# Patient Record
Sex: Female | Born: 1935 | Race: White | Hispanic: No | Smoking: Never smoker
Health system: Southern US, Community
[De-identification: ages and names within clinical notes are randomized; demographics above are authoritative.]

## PROBLEM LIST (undated history)

## (undated) DIAGNOSIS — I429 Cardiomyopathy, unspecified: Secondary | ICD-10-CM

## (undated) DIAGNOSIS — I472 Ventricular tachycardia: Secondary | ICD-10-CM

## (undated) DIAGNOSIS — R079 Chest pain, unspecified: Secondary | ICD-10-CM

## (undated) DIAGNOSIS — I5022 Chronic systolic (congestive) heart failure: Secondary | ICD-10-CM

## (undated) DIAGNOSIS — Z950 Presence of cardiac pacemaker: Secondary | ICD-10-CM

## (undated) DIAGNOSIS — N259 Disorder resulting from impaired renal tubular function, unspecified: Secondary | ICD-10-CM

## (undated) DIAGNOSIS — I10 Essential (primary) hypertension: Secondary | ICD-10-CM

## (undated) HISTORY — DX: Chronic systolic (congestive) heart failure: I50.22

## (undated) HISTORY — DX: Essential (primary) hypertension: I10

## (undated) HISTORY — DX: Disorder resulting from impaired renal tubular function, unspecified: N25.9

## (undated) HISTORY — DX: Chest pain, unspecified: R07.9

## (undated) HISTORY — PX: INSERT / REPLACE / REMOVE PACEMAKER: SUR710

## (undated) HISTORY — DX: Presence of cardiac pacemaker: Z95.0

## (undated) HISTORY — DX: Ventricular tachycardia: I47.2

## (undated) HISTORY — DX: Cardiomyopathy, unspecified: I42.9

---

## 2004-04-10 ENCOUNTER — Other Ambulatory Visit: Payer: Self-pay

## 2004-07-22 ENCOUNTER — Ambulatory Visit: Payer: Self-pay | Admitting: Unknown Physician Specialty

## 2005-03-09 ENCOUNTER — Ambulatory Visit: Payer: Self-pay | Admitting: Ophthalmology

## 2005-03-16 ENCOUNTER — Ambulatory Visit: Payer: Self-pay | Admitting: Ophthalmology

## 2005-08-26 ENCOUNTER — Emergency Department: Payer: Self-pay | Admitting: Emergency Medicine

## 2005-08-26 ENCOUNTER — Other Ambulatory Visit: Payer: Self-pay

## 2005-11-16 ENCOUNTER — Encounter: Payer: Self-pay | Admitting: Internal Medicine

## 2005-12-06 ENCOUNTER — Ambulatory Visit: Payer: Self-pay | Admitting: Family Medicine

## 2006-05-03 ENCOUNTER — Ambulatory Visit: Payer: Self-pay | Admitting: Unknown Physician Specialty

## 2006-07-10 ENCOUNTER — Ambulatory Visit: Payer: Self-pay | Admitting: Podiatry

## 2006-12-04 ENCOUNTER — Ambulatory Visit: Payer: Self-pay | Admitting: Internal Medicine

## 2006-12-15 ENCOUNTER — Ambulatory Visit: Payer: Self-pay | Admitting: Internal Medicine

## 2006-12-15 ENCOUNTER — Observation Stay (HOSPITAL_COMMUNITY): Admission: RE | Admit: 2006-12-15 | Discharge: 2006-12-16 | Payer: Self-pay | Admitting: Internal Medicine

## 2007-01-08 ENCOUNTER — Ambulatory Visit: Payer: Self-pay | Admitting: Internal Medicine

## 2007-03-06 ENCOUNTER — Ambulatory Visit: Payer: Self-pay | Admitting: Internal Medicine

## 2007-04-25 ENCOUNTER — Ambulatory Visit: Payer: Self-pay | Admitting: Family Medicine

## 2007-05-26 ENCOUNTER — Other Ambulatory Visit: Payer: Self-pay

## 2007-05-26 ENCOUNTER — Emergency Department: Payer: Self-pay | Admitting: Emergency Medicine

## 2007-06-08 ENCOUNTER — Ambulatory Visit: Payer: Self-pay

## 2007-07-24 ENCOUNTER — Ambulatory Visit: Payer: Self-pay | Admitting: Family

## 2007-09-04 ENCOUNTER — Ambulatory Visit: Payer: Self-pay | Admitting: Internal Medicine

## 2007-10-29 ENCOUNTER — Ambulatory Visit: Payer: Self-pay | Admitting: Family

## 2007-11-12 ENCOUNTER — Ambulatory Visit: Payer: Self-pay

## 2007-11-22 ENCOUNTER — Ambulatory Visit: Payer: Self-pay | Admitting: Internal Medicine

## 2008-02-25 ENCOUNTER — Ambulatory Visit: Payer: Self-pay | Admitting: Internal Medicine

## 2008-04-29 ENCOUNTER — Ambulatory Visit: Payer: Self-pay | Admitting: Family Medicine

## 2008-06-02 ENCOUNTER — Ambulatory Visit: Payer: Self-pay | Admitting: Internal Medicine

## 2008-10-15 ENCOUNTER — Ambulatory Visit: Payer: Self-pay | Admitting: Family

## 2008-11-28 ENCOUNTER — Encounter: Payer: Self-pay | Admitting: Internal Medicine

## 2008-12-08 ENCOUNTER — Ambulatory Visit: Payer: Self-pay | Admitting: Internal Medicine

## 2009-02-05 ENCOUNTER — Ambulatory Visit: Payer: Self-pay | Admitting: Family

## 2009-02-23 DIAGNOSIS — I429 Cardiomyopathy, unspecified: Secondary | ICD-10-CM | POA: Insufficient documentation

## 2009-02-23 DIAGNOSIS — R079 Chest pain, unspecified: Secondary | ICD-10-CM

## 2009-02-23 DIAGNOSIS — I472 Ventricular tachycardia: Secondary | ICD-10-CM

## 2009-02-23 DIAGNOSIS — I10 Essential (primary) hypertension: Secondary | ICD-10-CM | POA: Insufficient documentation

## 2009-02-23 DIAGNOSIS — N259 Disorder resulting from impaired renal tubular function, unspecified: Secondary | ICD-10-CM | POA: Insufficient documentation

## 2009-04-27 ENCOUNTER — Ambulatory Visit: Payer: Self-pay | Admitting: Family

## 2009-04-30 ENCOUNTER — Ambulatory Visit: Payer: Self-pay | Admitting: Family Medicine

## 2009-05-21 ENCOUNTER — Inpatient Hospital Stay: Payer: Self-pay | Admitting: Internal Medicine

## 2009-09-09 ENCOUNTER — Ambulatory Visit: Payer: Self-pay | Admitting: Family

## 2009-12-28 ENCOUNTER — Ambulatory Visit: Payer: Self-pay | Admitting: Internal Medicine

## 2009-12-28 DIAGNOSIS — I4949 Other premature depolarization: Secondary | ICD-10-CM

## 2010-02-25 ENCOUNTER — Ambulatory Visit: Payer: Self-pay | Admitting: Family

## 2010-04-14 ENCOUNTER — Ambulatory Visit: Payer: Self-pay | Admitting: Internal Medicine

## 2010-07-20 ENCOUNTER — Ambulatory Visit: Payer: Self-pay | Admitting: Cardiovascular Disease

## 2010-07-20 ENCOUNTER — Encounter: Payer: Self-pay | Admitting: Internal Medicine

## 2010-10-19 NOTE — Cardiovascular Report (Signed)
Summary: Office Visit   Office Visit   Imported By: Roderic Ovens 04/16/2010 10:21:01  _____________________________________________________________________  External Attachment:    Type:   Image     Comment:   External Document

## 2010-10-19 NOTE — Procedures (Signed)
Summary: PACER/AMD   Current Medications (verified): 1)  Omeprazole 20 Mg Cpdr (Omeprazole) .... Take 1 By Mouth Once Daily 2)  Glipizide 10 Mg Tabs (Glipizide) .... Take 1 By Mouth Once Daily 3)  Carvedilol 25 Mg Tabs (Carvedilol) .... Take One Tablet By Mouth Two Times A Day 4)  Meclizine Hcl 25 Mg Tabs (Meclizine Hcl) .... Take 1 By Mouth Once Daily 5)  Lipitor 40 Mg Tabs (Atorvastatin Calcium) .... Take One Tablet By Mouth Daily. 6)  Detrol La 4 Mg Xr24h-Cap (Tolterodine Tartrate) .... Take 1 By Mouth Once Daily 7)  Levoxyl 137 Mcg Tabs (Levothyroxine Sodium) .... Take 1 By Mouth Once Daily 8)  Tylenol Arthritis Pain 650 Mg Cr-Tabs (Acetaminophen) .... Take 2 By Mouth Once Daily 9)  Aspirin 81 Mg Tbec (Aspirin) .... Take One Tablet By Mouth Daily 10)  Nystop 100000 Unit/gm Powd (Nystatin) .... As Needed 11)  Proair Hfa 108 (90 Base) Mcg/act Aers (Albuterol Sulfate) .... As Needed 12)  Calcium-Vitamin D 600-200 Mg-Unit Tabs (Calcium-Vitamin D) .... Take 1 By Mouth Once Daily 13)  Furosemide 20 Mg Tabs (Furosemide) .... As Needed  Allergies (verified): 1)  ! Penicillin    ICD Specifications Following MD:  Sherryl Manges, MD     ICD Vendor:  Medtronic     ICD Model Number:  7299     ICD Serial Number:  [id number] ICD DOI:  12/15/2006     ICD Implanting MD:  Sherryl Manges, MD  Lead 1:    Location: RA     DOI: 12/15/2006     Model #: 5643     Serial #: [id number]     Status: active Lead 2:    Location: RV     DOI: 12/15/2006     Model #: 6063     Serial #: [id number]     Status: active Lead 3:    Location: LV     DOI: 12/15/2006     Model #: 3557     Serial #: [id number]     Status: active  Indications::  NICM, CHF   ICD Follow Up Remote Check?  No Battery Voltage:  2.66 V     Charge Time:  10.48 seconds     Underlying rhythm:  SR ICD Dependent:  No       ICD Device Measurements Atrium:  Amplitude: 1.0 mV, Impedance: 496 ohms, Threshold: 1.0 V at 0.2 msec Right Ventricle:  Amplitude:  13.2 mV, Impedance: 560 ohms, Threshold: 1.0 V at 0.3 msec Left Ventricle:  Impedance: 640 ohms, Threshold: 3.0 V at 1.0 msec  Episodes MS Episodes:  5     Percent Mode Switch:  < 2 minutes     Coumadin:  No Shock:  0     ATP:  0     Nonsustained:  0     Atrial Pacing:  0.2%     Ventricular Pacing:  69.2%  Brady Parameters Mode DDDR     Lower Rate Limit:  50     Upper Rate Limit 130 PAV 130     Sensed AV Delay:  100  Tachy Zones VF:  200     VT:  250     VT1:  171     Next Cardiology Appt Due:  06/19/2010 Tech Comments:  SR with frequent PAC's today.  Optivol and thoracic impedance abnormal 4/13-5/1.  ROV 3 months Home clinic. Altha Harm, LPN  April 14, 2010 9:13 AM

## 2010-10-19 NOTE — Cardiovascular Report (Signed)
Summary: Office Visit   Office Visit   Imported By: Roderic Ovens 07/29/2010 14:28:00  _____________________________________________________________________  External Attachment:    Type:   Image     Comment:   External Document

## 2010-10-19 NOTE — Assessment & Plan Note (Signed)
Summary: 1 year rov  icd ck   CC:  ROV; Device Check.  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 She is status post CRT implantation. She is a history of nonsustained ventricular tachycardia but no therapeutic interventions via her ICD .  She has no acute complaints of shortness of breath or chest pain or palpitations.   there have been no ICD discharges.  Issues with her family remaining a concern for her; Dr Haynes Hoehn is on board with this  Problems Prior to Update: 1)  Systolic Heart Failure, Chronic  (ICD-428.22) 2)  Cardiomyopathy, Secondary  (ICD-425.9) 3)  Icd - in Situ  (ICD-V45.02) 4)  Chest Pain-unspecified  (ICD-786.50) 5)  Hypertension, Unspecified  (ICD-401.9) 6)  Ventricular Tachycardia  (ICD-427.1) 7)  Renal Insufficiency  (ICD-588.9)  Current Medications (verified): 1)  Omeprazole 20 Mg Cpdr (Omeprazole) .... Take 1 By Mouth Once Daily 2)  Glipizide 10 Mg Tabs (Glipizide) .... Take 1 By Mouth Once Daily 3)  Carvedilol 25 Mg Tabs (Carvedilol) .... Take One Tablet By Mouth Two Times A Day 4)  Meclizine Hcl 25 Mg Tabs (Meclizine Hcl) .... Take 1 By Mouth Once Daily 5)  Lipitor 40 Mg Tabs (Atorvastatin Calcium) .... Take One Tablet By Mouth Daily. 6)  Detrol La 4 Mg Xr24h-Cap (Tolterodine Tartrate) .... Take 1 By Mouth Once Daily 7)  Levoxyl 137 Mcg Tabs (Levothyroxine Sodium) .... Take 1 By Mouth Once Daily 8)  Tylenol Arthritis Pain 650 Mg Cr-Tabs (Acetaminophen) .... Take 2 By Mouth Once Daily 9)  Aspirin 81 Mg Tbec (Aspirin) .... Take One Tablet By Mouth Daily 10)  Nystop 100000 Unit/gm Powd (Nystatin) .... As Needed 11)  Proair Hfa 108 (90 Base) Mcg/act Aers (Albuterol Sulfate) .... As Needed 12)  Calcium-Vitamin D 600-200 Mg-Unit Tabs (Calcium-Vitamin D) .... Take 1 By Mouth Once Daily  Allergies (verified): 1)  ! Penicillin  Past History:  Past Medical History: Last updated:  02/23/2009 SYSTOLIC HEART FAILURE, CHRONIC (ICD-428.22) CARDIOMYOPATHY, SECONDARY (ICD-425.9) ICD - IN SITU (ICD-V45.02) CHEST PAIN-UNSPECIFIED (ICD-786.50) HYPERTENSION, UNSPECIFIED (ICD-401.9) VENTRICULAR TACHYCARDIA (ICD-427.1) RENAL INSUFFICIENCY (ICD-588.9)    Vital Signs:  Patient profile:   77 year old female Height:      67 inches Weight:      161 pounds BMI:     25.31 Pulse rate:   75 / minute BP sitting:   112 / 62  (left arm)  Vitals Entered By: Stanton Kidney, EMT-P (December 28, 2009 11:31 AM)  Physical Exam  General:  The patient was alert and oriented in no acute distress. HEENT Normal.  Neck veins were flat, carotids were brisk.  Lungs were clear.  Heart sounds were regularly irregular without murmurs or gallops.  Abdomen was soft with active bowel sounds. There is no clubbing cyanosis or edema. Skin Warm and dry she walks with a cane    EKG  Procedure date:  12/28/2009  Findings:      sinus rhythm at 75 with ventricular bigeminy with a left bundle superior axis morphology PVC Intervals 0.13/0.13/0.43 Axis is rightward at 127   ICD Specifications Following MD:  Sherryl Manges, MD     ICD Vendor:  Medtronic     ICD Model Number:  7299     ICD Serial Number:  [id number] ICD DOI:  12/15/2006     ICD Implanting MD:  Sherryl Manges, MD  Lead 1:    Location: RA  DOI: 12/15/2006     Model #: 1610     Serial #: [id number]     Status: active Lead 2:    Location: RV     DOI: 12/15/2006     Model #: 1914     Serial #: [id number]     Status: active Lead 3:    Location: LV     DOI: 12/15/2006     Model #: 0865     Serial #: [id number]     Status: active  Indications::  NICM, CHF   ICD Follow Up Remote Check?  No Battery Voltage:  2.81 V     Charge Time:  10.48 seconds     ICD Dependent:  No       ICD Device Measurements Atrium:  Amplitude: 2.0 mV, Impedance: 512 ohms, Threshold: 1.0 V at 0.2 msec Right Ventricle:  Amplitude: 7.6 mV, Impedance: 536 ohms, Threshold:  1.0 V at 0.2 msec Left Ventricle:  Impedance: 640 ohms, Threshold: 3.0 V at 0.5 msec  Episodes MS Episodes:  9     Percent Mode Switch:  <76minutes     Coumadin:  No Shock:  0     ATP:  0     Nonsustained:  0     Atrial Pacing:  0.2%     Ventricular Pacing:  83.3%  Brady Parameters Mode DDD     Lower Rate Limit:  50     Upper Rate Limit 130 PAV 130     Sensed AV Delay:  100  Tachy Zones VF:  200     VT:  250     VT1:  171     Next Cardiology Appt Due:  03/19/2010 Tech Comments:  Optivol and thoracic impedance abnormal.  12/9-3/5.  Total mode switch time < 2 minutes, the longest episode 13 seconds.   LV reprogrammed 3.0@1 .5, ventricular sense response on.  ROV 3 months in the [place] clinic. Altha Harm, LPN  December 28, 2009 11:58 AM   Impression & Recommendations:  Problem # 1:  PREMATURE VENTRICULAR CONTRACTIONS (ICD-427.69) the complex frequent ventricular ectopy is new. We have programmed on ventricular sensed response to her ICD. The overall burden of the ectopy in the short-term appears to be greater than her baseline and she has 20 PVCs per hour year to date. If she continues at this frequency issues related to cardiomyopathy and need to be considered; I suspect this would manifest itself as worsening congestive heart failure. Her updated medication list for this problem includes:    Carvedilol 25 Mg Tabs (Carvedilol) .Marland Kitchen... Take one tablet by mouth two times a day    Aspirin 81 Mg Tbec (Aspirin) .Marland Kitchen... Take one tablet by mouth daily  Problem # 2:  SYSTOLIC HEART FAILURE, CHRONIC (ICD-428.22) this remains relatively stable currently; please see the above Her updated medication list for this problem includes:    Carvedilol 25 Mg Tabs (Carvedilol) .Marland Kitchen... Take one tablet by mouth two times a day    Aspirin 81 Mg Tbec (Aspirin) .Marland Kitchen... Take one tablet by mouth daily  Problem # 3:  CARDIOMYOPATHY, SECONDARY (ICD-425.9) stable. She is not on an ACE inhibitor. This is deferred to Dr.  Darrold Junker Her updated medication list for this problem includes:    Carvedilol 25 Mg Tabs (Carvedilol) .Marland Kitchen... Take one tablet by mouth two times a day    Aspirin 81 Mg Tbec (Aspirin) .Marland Kitchen... Take one tablet by mouth daily  Problem # 4:  ICD - IN SITU (ICD-V45.02)  Device parameters and data were reviewed ; ventricular sense response was activated  \  Patient Instructions: 1)  Your physician recommends that you schedule a follow-up appointment in: 3 months in pacemaker clinic

## 2010-10-19 NOTE — Procedures (Signed)
Summary: pacer check f/u 3 months with paula   Current Medications (verified): 1)  Omeprazole 20 Mg Cpdr (Omeprazole) .... Take 1 By Mouth Once Daily 2)  Glipizide 10 Mg Tabs (Glipizide) .... Take 1 By Mouth Once Daily 3)  Carvedilol 25 Mg Tabs (Carvedilol) .... Take One Tablet By Mouth Two Times A Day 4)  Meclizine Hcl 25 Mg Tabs (Meclizine Hcl) .... Take 1 By Mouth Once Daily 5)  Lipitor 40 Mg Tabs (Atorvastatin Calcium) .... Take One Tablet By Mouth Daily. 6)  Detrol La 4 Mg Xr24h-Cap (Tolterodine Tartrate) .... Take 1 By Mouth Once Daily 7)  Levoxyl 137 Mcg Tabs (Levothyroxine Sodium) .... Take 1 By Mouth Once Daily 8)  Tylenol Arthritis Pain 650 Mg Cr-Tabs (Acetaminophen) .... Take 2 By Mouth Once Daily 9)  Aspirin 81 Mg Tbec (Aspirin) .... Take One Tablet By Mouth Daily 10)  Nystop 100000 Unit/gm Powd (Nystatin) .... As Needed 11)  Proair Hfa 108 (90 Base) Mcg/act Aers (Albuterol Sulfate) .... As Needed 12)  Calcium-Vitamin D 600-200 Mg-Unit Tabs (Calcium-Vitamin D) .... Take 1 By Mouth Once Daily 13)  Furosemide 20 Mg Tabs (Furosemide) .... As Needed  Allergies (verified): 1)  ! Penicillin    ICD Specifications Following MD:  Sherryl Manges, MD     ICD Vendor:  Medtronic     ICD Model Number:  7299     ICD Serial Number:  [id number] H ICD DOI:  12/15/2006     ICD Implanting MD:  Sherryl Manges, MD  Lead 1:    Location: RA     DOI: 12/15/2006     Model #: 4098     Serial #: [id number]     Status: active Lead 2:    Location: RV     DOI: 12/15/2006     Model #: 6213     Serial #: [id number]     Status: active Lead 3:    Location: LV     DOI: 12/15/2006     Model #: 6295     Serial #: [id number]     Status: active  Indications::  NICM, CHF   ICD Follow Up Remote Check?  No Battery Voltage:  2.64 V     Charge Time:  11.79 seconds     Underlying rhythm:  SR ICD Dependent:  No       ICD Device Measurements Atrium:  Amplitude: 2.1 mV, Impedance: 464 ohms, Threshold: 1.0 V at 0.3  msec Right Ventricle:  Amplitude: 6.9 mV, Impedance: 544 ohms, Threshold: 1.0 V at 0.3 msec Left Ventricle:  Impedance: 600 ohms, Threshold: 2.0 V at 1.4 msec Shock Impedance: 35/46 ohms   Episodes MS Episodes:  1     Percent Mode Switch:  <2 minutes     Coumadin:  No Shock:  0     ATP:  0     Nonsustained:  0     Atrial Pacing:  0.2%     Ventricular Pacing:  77.6%  Brady Parameters Mode DDDR     Lower Rate Limit:  50     Upper Rate Limit 130 PAV 130     Sensed AV Delay:  100  Tachy Zones VF:  200     VT:  250     VT1:  171     Next Cardiology Appt Due:  08/19/2010 Tech Comments:  No parameter changes.  Device function normal.  Optivol and thoracic impedance abnormal ongoing since mid Sept.  She will take her as  needed lasix x 3 days per Dr. Mariah Milling and return in 1 month for a recheck.   Altha Harm, LPN  July 20, 2010 4:47 PM

## 2010-10-21 ENCOUNTER — Ambulatory Visit: Payer: Self-pay | Admitting: Family

## 2010-10-28 ENCOUNTER — Encounter: Payer: Self-pay | Admitting: Internal Medicine

## 2010-10-28 ENCOUNTER — Encounter (INDEPENDENT_AMBULATORY_CARE_PROVIDER_SITE_OTHER): Payer: Medicare Other

## 2010-10-28 DIAGNOSIS — I4891 Unspecified atrial fibrillation: Secondary | ICD-10-CM

## 2010-10-29 ENCOUNTER — Telehealth (INDEPENDENT_AMBULATORY_CARE_PROVIDER_SITE_OTHER): Payer: Self-pay | Admitting: *Deleted

## 2010-11-04 NOTE — Progress Notes (Signed)
Summary: Called pt  Phone Note Outgoing Call Call back at [facility] Phone [phone]   Call placed by: Harlon Flor,  October 29, 2010 7:56 AM Call placed to: Patient Summary of Call: Attempted to call the to schedule an appt on 2/24 @ 9:00 with Graciela Husbands per [place].  The number was updated at the appt yesterday by the pt.  I have tried to call the number that was given to me yesterday and it is not correct. Initial call taken by: Harlon Flor,  October 29, 2010 7:57 AM  Follow-up for Phone Call        Attempted to call phone #'s on record, they are either disconnected or incorrect. Follow-up by: Altha Harm, LPN,  October 29, 2010 4:44 PM

## 2010-11-04 NOTE — Procedures (Signed)
Summary: PACER CHECK 1 MONTH/NR   Current Medications (verified): 1)  Omeprazole 20 Mg Cpdr (Omeprazole) .... Take 1 By Mouth Once Daily 2)  Glipizide 10 Mg Tabs (Glipizide) .... Take 1 By Mouth Once Daily 3)  Carvedilol 25 Mg Tabs (Carvedilol) .... Take One Tablet By Mouth Two Times A Day 4)  Meclizine Hcl 25 Mg Tabs (Meclizine Hcl) .... Take 1 By Mouth Once Daily 5)  Lipitor 40 Mg Tabs (Atorvastatin Calcium) .... Take One Tablet By Mouth Daily. 6)  Detrol La 4 Mg Xr24h-Cap (Tolterodine Tartrate) .... Take 1 By Mouth Once Daily 7)  Levoxyl 137 Mcg Tabs (Levothyroxine Sodium) .... Take 1 By Mouth Once Daily 8)  Tylenol Arthritis Pain 650 Mg Cr-Tabs (Acetaminophen) .... Take 2 By Mouth Once Daily 9)  Aspirin 81 Mg Tbec (Aspirin) .... Take One Tablet By Mouth Daily 10)  Nystop 100000 Unit/gm Powd (Nystatin) .... As Needed 11)  Proair Hfa 108 (90 Base) Mcg/act Aers (Albuterol Sulfate) .... As Needed 12)  Calcium-Vitamin D 600-200 Mg-Unit Tabs (Calcium-Vitamin D) .... Take 1 By Mouth Once Daily 13)  Furosemide 20 Mg Tabs (Furosemide) .... As Needed  Allergies (verified): 1)  ! Penicillin    ICD Specifications Following MD:  Sherryl Manges, MD     ICD Vendor:  Medtronic     ICD Model Number:  7299     ICD Serial Number:  [id number] H ICD DOI:  12/15/2006     ICD Implanting MD:  Sherryl Manges, MD  Lead 1:    Location: RA     DOI: 12/15/2006     Model #: 4098     Serial #: [id number]     Status: active Lead 2:    Location: RV     DOI: 12/15/2006     Model #: 6213     Serial #: [id number]     Status: active Lead 3:    Location: LV     DOI: 12/15/2006     Model #: 6295     Serial #: [id number]     Status: active  Indications::  NICM, CHF   ICD Follow Up Remote Check?  No Battery Voltage:  2.62 V     Charge Time:  11.79 seconds     Battery Est. Longevity:  ERI Underlying rhythm:  SR ICD Dependent:  No       ICD Device Measurements Atrium:  Amplitude: 1.1 mV, Impedance: 432 ohms, Threshold: 1.0  V at 0.3 msec Right Ventricle:  Amplitude: 8.0 mV, Impedance: 536 ohms, Threshold: 1.0 V at 0.3 msec Left Ventricle:  Impedance: 592 ohms, Threshold: 2.0 V at 1.4 msec Shock Impedance: 37/47 ohms   Episodes MS Episodes:  4     Coumadin:  No Shock:  0     ATP:  0     Nonsustained:  0     Atrial Pacing:  0.3%     Ventricular Pacing:  75%  Brady Parameters Mode DDDR     Lower Rate Limit:  50     Upper Rate Limit 130 PAV 130     Sensed AV Delay:  100  Tachy Zones VF:  200     VT:  250     VT1:  171     Tech Comments:  Battery @ ERI since 08/07/10.  Optivol and thoracic impedance abnormal 12/7-1/24.  To see Dr. Graciela Husbands later this month to discuss change out. Altha Harm, LPN  October 28, 2010 9:48 AM

## 2010-11-12 ENCOUNTER — Encounter (INDEPENDENT_AMBULATORY_CARE_PROVIDER_SITE_OTHER): Payer: Medicare Other | Admitting: Internal Medicine

## 2010-11-12 ENCOUNTER — Encounter: Payer: Self-pay | Admitting: Internal Medicine

## 2010-11-12 DIAGNOSIS — I428 Other cardiomyopathies: Secondary | ICD-10-CM

## 2010-11-12 DIAGNOSIS — Z95810 Presence of automatic (implantable) cardiac defibrillator: Secondary | ICD-10-CM

## 2010-11-12 DIAGNOSIS — I5022 Chronic systolic (congestive) heart failure: Secondary | ICD-10-CM

## 2010-11-16 NOTE — Assessment & Plan Note (Signed)
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 She is status post CRT implantation. She is a history of nonsustained ventricular tachycardia but no therapeutic interventions via her ICD .   The patient denies SOB, chest pain, edema or palpitations  Her device has reached ERI actually she did so in November.  She missed her December appointment.  catheter she complains of some pocket soreness   Current Medications (verified): 1)  Omeprazole 20 Mg Cpdr (Omeprazole) .... Take 1 By Mouth Once Daily 2)  Glipizide 10 Mg Tabs (Glipizide) .... Take 1 By Mouth Once Daily 3)  Carvedilol 25 Mg Tabs (Carvedilol) .... Take One Tablet By Mouth Two Times A Day 4)  Meclizine Hcl 25 Mg Tabs (Meclizine Hcl) .... Take 1 By Mouth Once Daily 5)  Lipitor 40 Mg Tabs (Atorvastatin Calcium) .... Take One Tablet By Mouth Daily. 6)  Detrol La 4 Mg Xr24h-Cap (Tolterodine Tartrate) .... Take 1 By Mouth Once Daily 7)  Levoxyl 137 Mcg Tabs (Levothyroxine Sodium) .... Take 1 By Mouth Once Daily 8)  Tylenol Arthritis Pain 650 Mg Cr-Tabs (Acetaminophen) .... Take 2 By Mouth Once Daily 9)  Aspirin 81 Mg Tbec (Aspirin) .... Take One Tablet By Mouth Daily 10)  Nystop 100000 Unit/gm Powd (Nystatin) .... As Needed 11)  Proair Hfa 108 (90 Base) Mcg/act Aers (Albuterol Sulfate) .... As Needed 12)  Calcium-Vitamin D 600-200 Mg-Unit Tabs (Calcium-Vitamin D) .... Take 1 By Mouth Once Daily 13)  Furosemide 20 Mg Tabs (Furosemide) .... As Needed  Allergies (verified): 1)  ! Penicillin  Past History:  Past Medical History: Last updated: 02/23/2009 SYSTOLIC HEART FAILURE, CHRONIC (ICD-428.22) CARDIOMYOPATHY, SECONDARY (ICD-425.9) ICD - IN SITU (ICD-V45.02) CHEST PAIN-UNSPECIFIED (ICD-786.50) HYPERTENSION, UNSPECIFIED  (ICD-401.9) VENTRICULAR TACHYCARDIA (ICD-427.1) RENAL INSUFFICIENCY (ICD-588.9)    Past Surgical History: Last updated: 02/23/2009  Medtronic InSync Sentry 7299 ICD - 12/15/2006 - nonischemic cardiomyopathy  Vital Signs:  Patient profile:   77 year old female Height:      67 inches Weight:      148.50 pounds BMI:     23.34 Pulse rate:   76 / minute BP sitting:   108 / 68  (left arm) Cuff size:   regular  Vitals Entered By: Lysbeth Galas CMA (November 12, 2010 9:09 AM)  Physical Exam  General:  The patient was alert and oriented in no acute distress. HEENT Normal.  Neck veins were flat, carotids were brisk Device pocket was well-healed. There is no overlying erythema or warmth. She does have superficial venous anastomoses.  Lungs were clear.  Heart sounds were regular without murmurs or gallops.  Abdomen was soft with active bowel sounds. There is no clubbing cyanosis or edema. Skin Warm and dry     ICD Specifications Following MD:  Sherryl Manges, MD     ICD Vendor:  Medtronic     ICD Model Number:  7299     ICD Serial Number:  [id number] H ICD DOI:  12/15/2006     ICD Implanting MD:  Sherryl Manges, MD  Lead 1:    Location: RA     DOI: 12/15/2006     Model #: 4098     Serial #: [id number]     Status: active Lead 2:    Location: RV     DOI: 12/15/2006     Model #:  [id number]     Serial #: [id number]     Status: active Lead 3:    Location: LV     DOI: 12/15/2006     Model #: 7619     Serial #: [id number]     Status: active  Indications::  NICM, CHF   ICD Follow Up ICD Dependent:  No      Episodes Coumadin:  No  Brady Parameters Mode DDDR     Lower Rate Limit:  50     Upper Rate Limit 130 PAV 130     Sensed AV Delay:  100  Tachy Zones VF:  200     VT:  250     VT1:  171     Impression & Recommendations:  Problem # 1:  ICD - IN SITU (ICD-V45.02) Her device has reached ERI. It will need to be replaced. There are increasing data on the lack of benefit of ICD in women and in the  elderly. Hence, we will plan to remove the CRT-D and replace it with CRT P. I have reviewed this with the patient and she voices understanding. We also discussed the potential risks including but not limited to infection. She except these risks and is willing to proceed.  Problem # 2:  CARDIOMYOPATHY, SECONDARY (ICD-425.9) stable on her current medications. I'm not sure why she does not take an ACE inhibitor. Her updated medication list for this problem includes:    Carvedilol 25 Mg Tabs (Carvedilol) .Marland Kitchen... Take one tablet by mouth two times a day    Aspirin 81 Mg Tbec (Aspirin) .Marland Kitchen... Take one tablet by mouth daily    Furosemide 20 Mg Tabs (Furosemide) .Marland Kitchen... As needed  Problem # 3:  SYSTOLIC HEART FAILURE, CHRONIC (ICD-428.22) stable on current medications.  Her updated medication list for this problem includes:    Carvedilol 25 Mg Tabs (Carvedilol) .Marland Kitchen... Take one tablet by mouth two times a day    Aspirin 81 Mg Tbec (Aspirin) .Marland Kitchen... Take one tablet by mouth daily    Furosemide 20 Mg Tabs (Furosemide) .Marland Kitchen... As needed  Problem # 4:  VENTRICULAR TACHYCARDIA (ICD-427.1) there has been no ventricular tachycardia for years according to the notes. We will reinterrogate her device. Her updated medication list for this problem includes:    Carvedilol 25 Mg Tabs (Carvedilol) .Marland Kitchen... Take one tablet by mouth two times a day    Aspirin 81 Mg Tbec (Aspirin) .Marland Kitchen... Take one tablet by mouth daily  Patient Instructions: 1)  Your physician recommends that you continue on your current medications as directed. Please refer to the Current Medication list given to you today. 2)  Your physician recommends that you have a generator change out, we will call you with an appt date and time.

## 2010-11-16 NOTE — Letter (Signed)
Summary: Implantable Device Instructions  Architectural technologist at [facility] [address]   [place], [place] [address]   Phone: [phone]  Fax: [phone]      Implantable Device Instructions  You are scheduled for:  _____ Permanent Transvenous Pacemaker _____ Implantable Cardioverter Defibrillator _____ Implantable Loop Recorder __X___ Generator Change  on 11/23/10 @ 7:30am with Dr. Graciela Husbands. Please arrive to [facility] at 6:30am.   1.  Please arrive at the [facility] Pre-Op on 11/17/10 @ 12:30pm. This is located in the [facility] on the 2nd floor. They will give you instructions for your procedure.  2.  Do not eat or drink the night before your procedure.  3.  Complete lab work on _____.  The lab at [facility] is open from 8:30 AM to 1:30 PM and from 2:30 PM to 5:00 PM.  The lab at [facility] is open from 7:30 AM to 5:30 PM.  You do not have to be fasting.  4.  Do NOT take these medications the morning of your procedure: Furosemide,     Glipizide.  5.  Plan for an overnight stay.  Bring your insurance cards and a list of your medications.   *If you have ANY questions after you get home, please call the office [phone].  *Every attempt is made to prevent procedures from being rescheduled.  Due to the nauture of Electrophysiology, rescheduling can happen.  The physician is always aware and directs the staff when this occurs.

## 2010-11-17 ENCOUNTER — Encounter: Payer: Self-pay | Admitting: Internal Medicine

## 2010-11-17 ENCOUNTER — Ambulatory Visit: Payer: Self-pay | Admitting: Internal Medicine

## 2010-11-18 ENCOUNTER — Telehealth: Payer: Self-pay | Admitting: Internal Medicine

## 2010-11-22 ENCOUNTER — Telehealth: Payer: Self-pay | Admitting: Internal Medicine

## 2010-11-23 ENCOUNTER — Telehealth: Payer: Self-pay | Admitting: Internal Medicine

## 2010-11-25 NOTE — Progress Notes (Signed)
Summary: Questions about procedure meds  Phone Note Call from Patient Call back at [phone]   Caller: [name] (granddaughter) Call For: [name] Summary of Call: What medications are the patient supposed take and what is she not supposed to take for her procedure on Tuesday?  She is having a pacemaker changeout. Initial call taken by: Harlon Flor,  November 18, 2010 3:35 PM  Follow-up for Phone Call        [facility] Lanny Hurst RN  November 18, 2010 5:04 PM  Follow-up by: Lanny Hurst RN,  November 18, 2010 5:04 PM  Additional Follow-up for Phone Call Additional follow up Details #1::        Called pt's grandaughter, left message with instructions that she should hold Glipizide and Furosemide as stated on her instructions I gave her in office. I stated that she could take important prescription meds in the AM with sip of water which may only be her carvedilol. Gave our number for call back if needed. Additional Follow-up by: Lanny Hurst RN,  November 19, 2010 2:39 PM

## 2010-11-25 NOTE — Cardiovascular Report (Signed)
Summary: Office Visit   Office Visit   Imported By: Roderic Ovens 11/15/2010 15:42:16  _____________________________________________________________________  External Attachment:    Type:   Image     Comment:   External Document

## 2010-11-27 ENCOUNTER — Inpatient Hospital Stay: Payer: Self-pay | Admitting: Internal Medicine

## 2010-11-30 NOTE — Progress Notes (Signed)
Summary: Fever  Phone Note Call from Patient Call back at Home Phone [phone] Call back at [phone]   Caller: Daughter [name]) Call For: [name] Summary of Call: Pt has been sick with a fever for the past 2 days.  Pt does not want to have the procedure if she is sick. Initial call taken by: Harlon Flor,  November 22, 2010 11:02 AM  Follow-up for Phone Call        Spoke to pt's daughter, she states pt has been vomitting the past 2 days, and "feels warm" has not actually taken temperature. Pt does not want to have surgery. Paged Dr. [name] to notify. Awaiting his call Lanny Hurst RN  November 22, 2010 2:20 PM  Spoke to Dr. [name], he states to reschedule pt. Musician cancelled with [employer], spoke to [place]. Pt notified surgery has been cancelled for tomorrow, and will call her back to reschedule. Pt is ok with this. Follow-up by: Lanny Hurst RN,  November 22, 2010 4:39 PM

## 2010-11-30 NOTE — Progress Notes (Signed)
Summary: Changeout  Phone Note Call from Patient Call back at [phone]   Caller: [name]  Call For: [name] Summary of Call: Would like to know when the changeout is going to be rescheduled for. Initial call taken by: Harlon Flor,  November 23, 2010 4:44 PM  Follow-up for Phone Call        Spoke to [name], we had cancelled pt's generator changeout due to her illness (vomitting/fever). [name] confirms that pt is now better and afebrile. Notified her I would have this rescheduled and will call her back.  Follow-up by: Lanny Hurst RN,  November 24, 2010 10:23 AM  Additional Follow-up for Phone Call Additional follow up Details #1::        Generator changeout r/s for 12/06/10 @ [facility] wtih Dr. [name] @ 11:00am, Dr. [name] and pt aware. Pt's pre-op scheduled for 11/30/10 @ 1000, orders were re-faxed. Additional Follow-up by: Lanny Hurst RN,  November 26, 2010 2:27 PM

## 2010-12-06 ENCOUNTER — Ambulatory Visit: Payer: Self-pay | Admitting: Internal Medicine

## 2010-12-06 DIAGNOSIS — T82198A Other mechanical complication of other cardiac electronic device, initial encounter: Secondary | ICD-10-CM

## 2010-12-07 ENCOUNTER — Ambulatory Visit (INDEPENDENT_AMBULATORY_CARE_PROVIDER_SITE_OTHER): Payer: Medicare Other | Admitting: Internal Medicine

## 2010-12-07 ENCOUNTER — Telehealth: Payer: Self-pay | Admitting: Internal Medicine

## 2010-12-07 DIAGNOSIS — Z950 Presence of cardiac pacemaker: Secondary | ICD-10-CM

## 2010-12-07 NOTE — Telephone Encounter (Signed)
Spoke to pt's grand-daughter [name]), she states that the pt's gauze is completely saturated with blood. Pt had generator changeout yesterday at [facility] by Dr. Graciela Husbands. Spoke to [facility], she will add pt on right now to see Dr. Ladona Ridgel. [name] notified, they will leave now and go to [facility] for appt.

## 2010-12-07 NOTE — Telephone Encounter (Signed)
Pt had a change out yesterday.  The gauze is soaked with blood.  Is this normal?

## 2010-12-07 NOTE — Progress Notes (Signed)
   Patient ID: [name], female    DOB: 1926/09/25, 77 y.o.   MRN: [id number]  HPI  [name] returns today after undergoing a device generator change by Dr. Graciela Husbands yesterday. She awoke and noted some bleeding and present for additional evaluation. No other complaints  Review of Systems    Physical Exam    Well appearing elderly woman no acute distress. PPM site has minimal ecchymosis and the steri-strips are intact with minimal amounts of blood. Minimal hematoma.  A/P  1. S/P PPM generator change 2. Small amount of incisional bleeding  Rec: I have asked the patient to undergo watchful waiting. No additional treatment today.

## 2010-12-20 ENCOUNTER — Encounter: Payer: Self-pay | Admitting: Internal Medicine

## 2010-12-20 ENCOUNTER — Other Ambulatory Visit: Payer: Self-pay

## 2010-12-20 ENCOUNTER — Ambulatory Visit (INDEPENDENT_AMBULATORY_CARE_PROVIDER_SITE_OTHER): Payer: Medicare Other | Admitting: Internal Medicine

## 2010-12-20 VITALS — BP 128/80 | HR 68 | Ht 68.0 in | Wt 150.0 lb

## 2010-12-20 DIAGNOSIS — Z950 Presence of cardiac pacemaker: Secondary | ICD-10-CM

## 2010-12-20 DIAGNOSIS — I428 Other cardiomyopathies: Secondary | ICD-10-CM

## 2010-12-20 NOTE — Patient Instructions (Signed)
Your physician recommends that you schedule a follow-up appointment in: 3 months.  

## 2010-12-20 NOTE — Progress Notes (Signed)
The patient is seen following recent device generator replacement with a Medtronic CRT D. Changed out for CRT P. She has had no significant problems since then the wound is healing well

## 2011-01-03 ENCOUNTER — Encounter: Payer: Self-pay | Admitting: Internal Medicine

## 2011-02-01 NOTE — Letter (Signed)
November 22, 2007    Luna Fuse, ANP  [facility]  [address]  [place], [place] [address]   RE:  RIYANNA, CRUTCHLEY  MRN:  [id number]  /  DOB:  07-Nov-1926   Dear [name]:   Adella Hare continues to do well following her CRT D implantation a  couple of years ago for her nonischemic cardiomyopathy.  She has modest  fatigue, but no significant shortness of breath.  She had some problems  with transient peripheral edema related to eating a bunch of cheese  crackers, which subsequently resolved without recurrence.   She has no orthopnea or nocturnal dyspnea.  Her medications remained  impeccable with ramipril, carvedilol 25 b.i.d., Levoxyl, meclizine,  glipizide and Lipitor 10.  Her blood pressure today was somewhat  elevated though at 147/94.  The pulse was 90.  Her lungs were clear,  heart sounds were regular.  Neck veins were flat and the extremities  were without edema.   Interrogation of her Medtronic [phone] ICD demonstrated a P-wave of 2.3  with impedance of 544, a threshold of 1 volt of 0.3.  The R-wave was 10  with impedance of 576 with a threshold of 1 volt and the LV impendence  was 720 with __________ of 2 volts of 0.5.  Heart excursion was  adequate.  She was 99% paced and her LIA in on.   IMPRESSION:  1. Nonischemic cardiomyopathy.  2. Congestive heart failure - chronic systolic - class 2.  3. Status post CRT -D for the above with 6949 lead.  4. Hypertension.   [name], Mrs. Fretwell is doing really quite well.  I did not recheck her  blood pressure, but it would probably be reasonable at her next visit if  her blood pressure remains elevated, to think about increasing her  Altace.   We will plan to see her again in six months' time and shall follow her  remotely in the interim.    Sincerely,      Duke Salvia, MD, [facility]  Electronically Signed    SCK/MedQ  DD: 11/22/2007  DT: 11/22/2007  Job #: [id number]

## 2011-02-01 NOTE — Letter (Signed)
March 06, 2007    Luna Fuse, [facility]  [facility]  [address]  [place], [place]  [address]   RE:  OKSANA, DEBERRY  MRN:  [id number]  /  DOB:  23-Apr-1927   Dear [name]:   I hope you are well.  Arine Foley was seen today.  She has a history of  cardiomyopathy, class 3 heart failure, moderately improved in function  capacity.   She has had one episode of nocturnal dyspnea.   Medications currently are Ramipril, Levoxyl  137 mcg Meclizine 25  b.i.d., Glipizide 10, carvedilol 6.25 mg , and Lipitor as well as  aspirin.   PHYSICAL EXAMINATION:  VITAL SIGNS:  Her blood pressure is 115/54.  The  pulse is 59.  NECK:  Veins flat.  LUNGS:  Clear.  CARDIAC:  Heart sounds reveal a 2/6 systolic murmur.   Interrogation of her Medtronic [id number] ICD demonstrated P waves of 2.6,  impedance of 608, threshold of 1 volt at 0.3 msec.  Her R wave measured  9.7 mV with an impedance of 544 ohms and a threshold of 1 volt at 0.2  msec.  LV of 5/10, 2/0.7.   No therapy.   Her device was reprogrammed to maximize longevity.   IMPRESSION:  1. Nonischemic cardiomyopathy.  2. Class 2 chronic systolic congestive failure.  3. Status post CRT for the above.   Ms. Infiniti, [place]ef[place], is doing pretty well.  Her device seems to be  programmed.  Her functional capacity is looking better.   She is to see you again in the next month or two.  I have taken the  liberty of setting her up for Care Link for [place] to follow in the interim.  In the event that you would like to have her follow up entirely at your  clinic, please feel free obviously, to do that and just let us know if  we can free her up for Care Link in action, but that way she does not  fall through the cracks.   If there is anything that we can do, please do not hesitate to contact  us.    Sincerely,      Duke Salvia, MD, [facility]  Electronically Signed    SCK/MedQ  DD: 03/06/2007  DT: 03/06/2007  Job #: [id number]

## 2011-02-01 NOTE — Letter (Signed)
December 08, 2008    Jamse Mead, MD  [facility]  [address]  [place], [place]  [address]   RE:  GULIANNA, HORNSBY  MRN:  [id number]  /  DOB:  30-May-1927   Dear Trinna Post,   Ms. Levene is seen in followup for cardiac resynchronization undertaken  for congestive heart failure in the setting of nonischemic  cardiomyopathy.  She continues to function in a class II way.   She has had some episodes of chest pain on November 02, 2008.  These  lasted a couple of minutes and she describes it has sharp and stabbing.  She has had none currently.   Her last cardiac assessment that I can find was a Myoview scan that was  notable for no ischemia.  I do not recall that we had information in the  second hand report related to perfusion defects.  Her echo was described  as globally hypokinetic.   MEDICATIONS:  Her medications currently include  1. Ramipril.  2. Levoxyl.  3. Glipizide.  4. Omeprazole.  5. Carvedilol.   PHYSICAL EXAMINATION:  VITAL SIGNS:  Her blood pressure today was  142/84, the pulse of 91, her weight was 179.  LUNGS:  Clear.  HEART:  Sounds were regular.  EXTREMITIES:  Without edema.  Her left was foot was in a boot.   Interrogation of her Medtronic Sentry ICD demonstrates a P-wave of 2.1,  impedance of 440 with threshold of 1 volt at 0.3.  The RV amplitude was  8.7 with impedance of 536, a threshold of 1 volt at 0.2 and the LV  impedance of 640 with threshold of 1.5 at 0.7.  Battery voltage 3.02.  There is no intercurrent therapies.  There was an intercurrent episode  of nonsustained ventricular tachycardia.   IMPRESSION:  1. Nonischemic cardiomyopathy.  2. Chronic congestive heart failure - Class II.  3. Status post Medtronic CRT-D for the above with intercurrent      nonsustained ventricular tachycardia, but no therapies.  4. Chest pain - Atypical likely not cardiac.  5. Chronic renal insufficiency.   Alex, Ms. Salim had some episodes of chest pain.  These  are unique for  her, I do not think that they were cardiac, but I suggested she follow  up with you and it might be worth repeating an evaluation and comparing  it with some of your previous data.   Her device was not reprogrammed today, and we will keep an eye on her  nonsustained VT.   Thanks very much for allowing [place] participate in her care.    Sincerely,      Duke Salvia, MD, [facility]  Electronically Signed    SCK/MedQ  DD: 12/08/2008  DT: 12/09/2008  Job #: [id number]

## 2011-02-01 NOTE — Assessment & Plan Note (Signed)
[facility]                         ELECTROPHYSIOLOGY OFFICE NOTE   NAME:Arnaud, BRECKYN TROYER                         MRN:          [id number]  DATE:06/08/2007                            DOB:          [birth year]    Ms. Manganello was seen today in the [facility] on June 08, 2007,  for followup of her Medtronic model [id number] Century.  Date of implant was  December 15, 2006, for nonischemic cardiomyopathy.   On interrogation of her device today:  Her battery voltage was 3.13 with  a charge time of 9.19 seconds.  P waves measured 2.6 millivolts with an  atrial capture threshold of 1 volt at 0.2 milliseconds and an atrial  lead impedance of 504 ohms.  R waves measured 8.7 millivolts with right  ventricular pacing threshold of 1 volt at 0.3 milliseconds and a right  ventricular lead impedance of 536 ohms.  Left ventricular pacing  threshold was 2 volts at 0.5 milliseconds with a left ventricular lead  impedance of 784 ohms.  Shock impedance was 41.  There were 4  nonsustained episodes since last interrogation and 6 mode switch  episodes noted.  She is ventricularly pacing 99.8% of the time.  No  changes were made in her parameters today.  Her underlying rhythm was a  sinus rhythm at 83 beats a minute.   She will send a CareLink transmission in with her return office visit in  March 2009 with Dr. Graciela Husbands.      Altha Harm, LPN  Electronically Signed      Duke Salvia, MD, [facility]  Electronically Signed   PO/MedQ  DD: 06/08/2007  DT: 06/08/2007  Job #: [id number]

## 2011-02-04 NOTE — Discharge Summary (Signed)
[name], [name] NO.:  [id number]   MEDICAL RECORD NO.:  [id number]          PATIENT TYPE:  OBV   LOCATION:  2807                         FACILITY:  [facility]   PHYSICIAN:  Doylene Canning. Ladona Ridgel, MD    DATE OF BIRTH:  1927-02-18   DATE OF ADMISSION:  12/15/2006  DATE OF DISCHARGE:  12/16/2006                               DISCHARGE SUMMARY   PROCEDURES:  Insertion of dual-chamber defibrillator implantation with a  Medtronic InSync Sentry 7299 biventricular device.   PRIMARY DIAGNOSES:  1. Nonischemic cardiomyopathy with an ejection fraction approximately      25%.  2. Chronic systolic congestive heart failure.  3. Hypertension.  4. Diabetes.  5. Hypothyroidism  6. Renal insufficiency with a creatinine of approximately 2.  7. Anemia.  8. Orthostatic dizziness.  9. Status post hemorrhoidectomy x2.  10.Cholecystectomy.  11.Ear surgery.  12.Breast surgery  13.Allergy or intolerance to PENICILLIN.  14.Left bundle branch block.  15.History of sinus tachycardia.   HOSPITAL COURSE:  [name] is a 77 year old female who was evaluated by  Dr. Graciela Husbands in the office for heart failure manifested by fatigue and left  ventricular dysfunction, putting her at increased risk of sudden death.  He felt that biventricular ICD implantation was indicated, and she came  to the hospital for this on December 15, 2006.   [name] had successful implantation of a Medtronic InSync Sentry  biventricular ICD and tolerated the procedure well.   Postprocedure, her device was interrogated and functioning well.  The  chest x-ray showed no pneumothorax.  She was evaluated on December 16, 2006, and considered stable for discharge with outpatient followup  arranged.   DISCHARGE INSTRUCTIONS:  1. Her activity level is to be increased gradually.  2. She is to follow up with Dr. Graciela Husbands for an ICD check and a followup      visit with Dr. Graciela Husbands.  Our office will call her with the      appointments.  3. She is to follow up with Tomasita Crumble at the [facility]      as needed.   DISCHARGE MEDICATIONS:  1. Coreg 12.5 mg and 6.25 mg b.i.d.  2. levothyroxine 137 mcg daily.  3. Ramipril 2.5 mg daily.  4. Glipizide 10 mg daily.  5. Detrol 4 mg daily.  6. Aspirin 81 mg daily.  7. Omeprazole 20 mg daily.  8. Lipitor 10 mg daily.  9. Furosemide 20 mg p.r.n.  10.Calcium with D.  11.Centrum Silver daily.  12.Biaxin 500 mg preoperative for dental procedures and other invasive      procedures.  13.Flexeril 5 mg 3 times a day.  14.Meclizine 25 mg daily.  15.Tylenol p.r.n.  16.Aranesp every month.  17.Albuterol inhaler p.r.n.Bjorn Loser Barrett, PA-C      Doylene Canning. Ladona Ridgel, MD  Electronically Signed    RB/MEDQ  D:  02/06/2007  T:  02/06/2007  Job:  [id number]   cc:   Tomma Rakers  [facility]

## 2011-02-04 NOTE — Op Note (Signed)
[name], [name] NO.:  [id number]   MEDICAL RECORD NO.:  [id number]          PATIENT TYPE:  INP   LOCATION:  2899                         FACILITY:  [facility]   PHYSICIAN:  Duke Salvia, MD, FACCDATE OF BIRTH:  March 30, 1927   DATE OF PROCEDURE:  12/15/2006  DATE OF DISCHARGE:                               OPERATIVE REPORT   PREOPERATIVE DIAGNOSIS:  Presumed nonischemic cardiomyopathy  classifications to varied left bundle branch block.   POSTOPERATIVE DIAGNOSIS:  Presumed nonischemic cardiomyopathy  classifications to varied left bundle branch block.   PROCEDURE:  Dual chamber defibrillator implantation with left  ventricular lead placement and intraoperative defibrillation threshold  testing.   After obtaining informed  consent, the patient was brought to the  electrophysiology laboratory and placed on the fluoroscopic table in the  supine position.  After obtaining prep and drape of the left upper  chest, lidocaine was infiltrated in the prepectoral subclavicular  region.  The incision was made and carried down to a layer of the  prepectoral fascia.  Using electrocautery and sharp dissection, a pocket  was formed similarly.  Hemostasis was obtained.   Thereafter, attention was turned again, access to the extrathoracic left  subclavian vein which was accomplished without difficulty and without  the aspiration of air or puncturing the artery.  Three separate  venipunctures were accomplished, guide wires were placed and retained.  Sequentially, a 9, 9.5 and 7-French hemostatic peel-away introducer  sheaths were placed, through which this passed a Medtronic 6947, 65 cm  dual coil active fixation defibrillator lead, serial #[id number], a  Medtronic MB2 coronary sinus cannulation catheter and a Medtronic 5760,  52 cm  active fixation atrial lead, serial #[id number].  Under  fluoroscopic guidance, the defibrillator lead was admitted through the  right  ventricular apex where the bipolar R wave of 9.1 with a pacing  impedance of 818 ohms and threshold of 0.6 volts at 0.5 milliseconds.  The threshold was 0.9 mA. The current of injury was adequate and there  was no diaphragmatic pacing at 10 volts.  This lead was secured to the  prepectoral fascia and then attached to a backup as the CS lead was then  placed.   A coronary sinus MB2 cannulation catheter was utilized in conjunction  with a Wholey wire which allowed for rapid cannulation of the coronary  sinus.  Because of the patient's renal insufficiency with a creatinine  of 1.6 or so, a puck shot was taken to demonstrate location of coronary  sinus and then two 8 mL injections were made for a total contrast use of  about 8 mL (1:1 mix).  This demonstrated a high lateral branch and a mid  lateral branch, the latter of which had a very tortuous ostial takeoff.  Because of this, the high lateral branch was utilized and with relative  ease, this vein was cannulated and because its relatively small diameter  image, a guide easy track 24543 bipolar LV lead was used, serial  [id number].  It was manipulated to the mid point between the base and the  apex.  In this location, the bipolar L wave was 27.3 with a pacing  impedance of 18, 20 ohms and a threshold of 1.4 volts at 0.5  milliseconds.  Current of threshold was 0.9 mA and there was no  diaphragmatic pacing at 10 volts.  The 9.5-French sheath was removed.  The MB2 was left in place and then the previously annunciated RA lead  was manipulated to the right atrial appendage where the bipolar P wave  of 2, with a pacing impedance of 663, a threshold of 1.5 volts at 0.5  milliseconds, and current of threshold was 2.7 mA.  Current of injury  was brisk.  This lead was secured to the prepectoral fascia and then the  coronary sinus cannulation delivery system was removed under  fluoroscopic guidance and the LV lead was then secured.  The leads were   then attached to the Medtronic InSync Sentry 7299 ICD serial #  [id number].  Through the device, the bipolar P wave is 2.2 with a pacing  impedance at 480, a threshold of 1 volt at 0.3.  The R wave was 11.4.  The pacing impedance is 704 ohms, a threshold of 1 volt at 0.4  milliseconds and the LV impedance was 624, and the threshold now is 2.5  volts at 0.4 milliseconds.  It was elected to leave the system in place.  The high voltage impedance was 43 ohms distally and 55 ohms proximally.   At this point, defibrillation threshold testing was undertaken.  Ventricular fibrillation was induced through the T wave shock.  After  the total duration of 6 seconds, a 20 joule shock was delivered,  measured at 41 ohms, terminated ventricular fibrillation and restored at  a normally paced rhythm.  TFT was assumed to be 25 joules.  The pocket  was copiously irrigated with antibiotic containing saline solution.  Hemostasis was assured.  Pulleys and the pulse generator were secured to  the prepectoral fascia, then the wound was closed in 3 layers in a  normal fashion.  The wound was washed, dried and a benzoin and Steri-  Strip dressing was applied.  Needle counts, sponge counts and instrument  counts were correct at the end of the procedure according to the staff.  The patient tolerated the procedure without apparent complications.      Duke Salvia, MD, [facility]  Electronically Signed     SCK/MEDQ  D:  12/15/2006  T:  12/15/2006  Job:  [id number]   cc:   Birdena Jubilee  [facility]  Electrophysiology Laboratory

## 2011-02-04 NOTE — Letter (Signed)
January 08, 2007    Luna Fuse, [facility]  [facility]  [address]  [place], [place] [address]   RE:  [name], [name]  MRN:  [id number]  /  DOB:  Jun 11, 1927   Dear [name],   I hope this letter finds you well.  [name] came in today following  CRT implantation.  Her wound is well-healed.  She is feeling better.   Her medications were reviewed and are unchanged.   On examination, her blood pressure was 115/60 and pulse was 80.  Lungs  were clear.  Heart sounds were regular.   Interrogation of her Medtronic Sentry CRT device demonstrated that her  LV threshold had gone from 1.5 volts at 0.4 to 5 volts at 0.4 and the LV  output was programmed accordingly.  Chest x-ray demonstrates that there  may be a little bit of dislodgement, but not a lot.   Location is still adequate.   We will plan to see her again in about 2 months for device reprogramming  and then have her come back and follow up with you guys.   [name], thanks very much and I hope this letter finds you well.  Call if  there is anything we can do.    Sincerely,      Duke Salvia, MD, [facility]  Electronically Signed    SCK/MedQ  DD: 01/08/2007  DT: 01/09/2007  Job #: [id number]

## 2011-02-04 NOTE — Letter (Signed)
December 04, 2006    [name], [facility]  [facility]  [address]  [place], [place] [address]   RE:  Desiree, [name]  MRN:  [id number]  /  DOB:  06-05-1927   Dear [name]:   It was a pleasure to see [name] today for consideration of ICD  implantation.   As you know, she is a 77 year old woman with a history of nonischemic  cardiomyopathy and congestive heart failure dating back a number of  years.  She has had ejection fraction in the 20s for a number of years  now with no evidence of ischemia by Myoview, although I do not ever see  that she had a catheterization, probably because of renal insufficiency.  Electrocardiogram was also notable for left bundle-branch block,  moderate MR, AI, and TR.   She has had problems with progressive shortness of breath and fatigue.  She is able to walk about 100 to 200 feet.   She has not had syncope or palpitations.   She last saw you, I guess, a couple of weeks ago at which time her Coreg  dose was increased.   Her past medical history is notable for:  1. Hypertension.  2. Diabetes.  3. Treated hypothyroidism.  4. Renal insufficiency with a creatinine most recently of 1.9.  5. Anemia.  6. Orthostatic lightheadedness.   Her past surgical is notable for hemorrhoidectomy x2, cholecystectomy,  ear surgery and breast surgery.   Her medication list is legion, [name].  She is currently taking  lisinopril and Altace, having gotten your instructions confused.  She is  on Aranesp 60 mcg monthly.  She is on Coreg 12.5 b.i.d., furosemide 20  p.r.n., glipizide 10, Levoxyl 112 mcg, Lipitor 10, meclizine p.r.n. for  dizziness.   Allergies - PENICILLIN.   Social history - She is widowed, she lives by herself.  She is very  involved with her daughter and two grandchildren.  She does not use  cigarettes, alcohol or recreational drugs.   On examination, she is an elderly Caucasian female appearing her stated  age of 51.  Her blood  pressure is 140/70, her pulse was 78, her weight  was 172.  Her HEENT exam demonstrated no icterus, no xanthomata.  The  neck veins were flat.  The carotids were brisk and full bilaterally  without bruits.  The back was without kyphosis or scoliosis.  The lungs  were clear, heart sounds were regular with a paradoxically split S2.  There is an early systolic murmur.  The PMI was displaced.  The abdomen  was soft with active bowel sounds without midline pulsation or  hepatomegaly.  Femoral pulses were 2+, distal pulses were intact.  There  was no clubbing, cyanosis, or edema.  The neurological exam was grossly  normal.  Her skin was warm and dry.   Electrocardiogram dated February 28 demonstrated sinus rhythm at 100  with intervals of 0.15/0.15/0.40.   IMPRESSION:  1. Presumed nonischemic cardiomyopathy.  2. Left bundle-branch block.  3. Class III congestive heart failure.  4. Renal insufficiency with a creatinine of 1.9.  5. Some confusion about medications.  6. Anemia, on Aranesp.  7. Sinus tachycardia.   [name], [name] has significant LV dysfunction, significant heart  failure manifested primarily by fatigue - you have done a good job of  keeping her dry, and certainly is at risk for sudden death.  Gradual up-  titration of her medications has been being pursued.  She comes  in today  taking two ACE inhibitors and her creatinine at last check was up from  1.7 to 1.9 so I took the liberty of asking her to hold one and I will  touch base with you about which one you would like to continue.  From  your last note it sounds like you would like to keep her on the  lisinopril so we will plan to do that.   We discussed treatment options with device therapy with the potential  benefits of both decreased risk of sudden death as well as improved  functional capacity.  She understands this and would like to proceed.  We also discussed the potential including but not limited to death,   perforation, infection, lead dislodgement, device failure and the lack  of benefit.  She understands these things.   We have tentatively scheduled her for March 28.  We will plan to get her  laboratories at that time.   I will plan to talk with you at that time also to figure out what we  should do next about her medications.   [name], thanks for asking [place] to see her.    Sincerely,      Duke Salvia, MD, [facility]  Electronically Signed    SCK/MedQ  DD: 12/04/2006  DT: 12/04/2006  Job #: [id number]   CC:   [name], M.D.  [place], [place].N.

## 2011-04-13 ENCOUNTER — Encounter: Payer: Medicare Other | Admitting: *Deleted

## 2011-06-15 ENCOUNTER — Encounter: Payer: Self-pay | Admitting: Internal Medicine

## 2011-06-17 ENCOUNTER — Ambulatory Visit (INDEPENDENT_AMBULATORY_CARE_PROVIDER_SITE_OTHER): Payer: Medicare Other | Admitting: *Deleted

## 2011-06-17 ENCOUNTER — Encounter: Payer: Self-pay | Admitting: Internal Medicine

## 2011-06-17 DIAGNOSIS — I429 Cardiomyopathy, unspecified: Secondary | ICD-10-CM

## 2011-06-17 DIAGNOSIS — I472 Ventricular tachycardia: Secondary | ICD-10-CM

## 2011-06-17 DIAGNOSIS — I5022 Chronic systolic (congestive) heart failure: Secondary | ICD-10-CM

## 2011-06-17 LAB — PACEMAKER DEVICE OBSERVATION
AL IMPEDENCE PM: 380 Ohm
AL THRESHOLD: 0.625 V
ATRIAL PACING PM: 1.24
BAMS-0001: 170 {beats}/min
RV LEAD IMPEDENCE PM: 399 Ohm
VENTRICULAR PACING PM: 85.68

## 2011-06-17 NOTE — Progress Notes (Signed)
PPM check with ICM 

## 2011-06-24 ENCOUNTER — Ambulatory Visit: Payer: Self-pay | Admitting: Family Medicine

## 2011-08-16 ENCOUNTER — Encounter: Payer: Medicare Other | Admitting: Internal Medicine

## 2011-09-15 ENCOUNTER — Inpatient Hospital Stay: Payer: Self-pay | Admitting: Internal Medicine

## 2011-09-29 ENCOUNTER — Encounter: Payer: Medicare Other | Admitting: Internal Medicine

## 2011-12-13 ENCOUNTER — Ambulatory Visit (INDEPENDENT_AMBULATORY_CARE_PROVIDER_SITE_OTHER): Payer: Medicare Other | Admitting: *Deleted

## 2011-12-13 ENCOUNTER — Encounter: Payer: Self-pay | Admitting: Internal Medicine

## 2011-12-13 ENCOUNTER — Encounter: Payer: Medicare Other | Admitting: Internal Medicine

## 2011-12-13 DIAGNOSIS — I5022 Chronic systolic (congestive) heart failure: Secondary | ICD-10-CM

## 2011-12-13 DIAGNOSIS — I472 Ventricular tachycardia: Secondary | ICD-10-CM

## 2011-12-13 LAB — PACEMAKER DEVICE OBSERVATION
AL IMPEDENCE PM: 437 Ohm
AL THRESHOLD: 0.875 V
ATRIAL PACING PM: 0.54
BAMS-0001: 170 {beats}/min
BATTERY VOLTAGE: 2.9674 V
DEV-0020ICD: NEGATIVE
RV LEAD IMPEDENCE PM: 418 Ohm

## 2011-12-13 NOTE — Progress Notes (Signed)
PPM check with ICM 

## 2012-01-23 ENCOUNTER — Institutional Professional Consult (permissible substitution): Payer: Medicare Other | Admitting: Cardiovascular Disease

## 2012-02-09 ENCOUNTER — Encounter: Payer: Self-pay | Admitting: Cardiovascular Disease

## 2012-02-09 ENCOUNTER — Ambulatory Visit (INDEPENDENT_AMBULATORY_CARE_PROVIDER_SITE_OTHER): Payer: Medicare Other | Admitting: Cardiovascular Disease

## 2012-02-09 VITALS — BP 135/81 | HR 78 | Ht 68.0 in | Wt 163.0 lb

## 2012-02-09 DIAGNOSIS — I472 Ventricular tachycardia: Secondary | ICD-10-CM

## 2012-02-09 DIAGNOSIS — N259 Disorder resulting from impaired renal tubular function, unspecified: Secondary | ICD-10-CM

## 2012-02-09 DIAGNOSIS — R0602 Shortness of breath: Secondary | ICD-10-CM

## 2012-02-09 DIAGNOSIS — R079 Chest pain, unspecified: Secondary | ICD-10-CM

## 2012-02-09 DIAGNOSIS — I10 Essential (primary) hypertension: Secondary | ICD-10-CM

## 2012-02-09 DIAGNOSIS — I429 Cardiomyopathy, unspecified: Secondary | ICD-10-CM

## 2012-02-09 NOTE — Assessment & Plan Note (Signed)
Normal ejection fraction in December 2012. She does have signs of fluid overload, possibly acute diastolic CHF with shortness of breath at nighttime when lying flat . We have suggested she increase her Lasix to 20 mg twice a day.

## 2012-02-09 NOTE — Progress Notes (Signed)
Patient ID: [name], female    DOB: [birth year], 78 y.o.   MRN: [id number]  HPI Comments: [name] has a history of nonischemic cardiomyopathy, complicated by congestive heart failure, diabetes , gout , outside echocardiogram suggesting moderate to severe mitral valve regurgitation, ejection fraction 55% in December 2012. She is status post CRT implantation. She is a history of nonsustained ventricular tachycardia but no therapeutic interventions via her ICD .  She was in the hospital in December 2012 with MRSA pneumonia, acute on chronic renal dysfunction   She reports that she has had some shortness of breath in the evenings when she lies flat. She takes Lasix once a day. She drinks significant water and diet Coke. Otherwise she has no complaints apart from her hearing. She lost her hearing aid while in the hospital.  Chest x-ray in the hospital December 2012 shows peripheral infiltrate in the right lower lobe consistent with pneumonia  Labs March 2012 Total cholesterol 73, LDL 27, HDL 20  EKG shows paced rhythm at 77 beats per minute   Outpatient Encounter Prescriptions as of 02/09/2012  Medication Sig Dispense Refill  . acetaminophen (TYLENOL ARTHRITIS PAIN) 650 MG CR tablet Take 650 mg by mouth every 8 (eight) hours as needed.        Marland Kitchen albuterol (PROAIR HFA) 108 (90 BASE) MCG/ACT inhaler Inhale 2 puffs into the lungs every 6 (six) hours as needed.        Marland Kitchen aspirin 81 MG EC tablet Take 81 mg by mouth daily.        Marland Kitchen atorvastatin (LIPITOR) 40 MG tablet Take 40 mg by mouth daily.        . Calcium Carbonate-Vitamin D (CALCIUM-VITAMIN D) 600-200 MG-UNIT CAPS Take 1 capsule by mouth daily.        . carvedilol (COREG) 25 MG tablet Take 25 mg by mouth 2 (two) times daily with a meal.       . ciprofloxacin (CIPRO XR) 500 MG 24 hr tablet Take 500 mg by mouth daily. Take one tablet everyday until gone.       . furosemide (LASIX) 20 MG tablet Take 20 mg by mouth 2 (two) times daily as needed.         Marland Kitchen glipiZIDE (GLUCOTROL) 10 MG tablet Take 10 mg by mouth daily.        Marland Kitchen levothyroxine (SYNTHROID, LEVOTHROID) 137 MCG tablet Take 137 mcg by mouth daily.        . meclizine (ANTIVERT) 25 MG tablet Take 25 mg by mouth daily.        Marland Kitchen nystatin (NYSTOP) 100000 UNIT/GM POWD Apply topically as needed.        Marland Kitchen omeprazole (PRILOSEC) 20 MG capsule Take 20 mg by mouth daily.        Marland Kitchen tolterodine (DETROL LA) 4 MG 24 hr capsule Take 4 mg by mouth daily.          Review of Systems  Constitutional: Negative.   HENT: Negative.   Eyes: Negative.   Respiratory: Positive for shortness of breath.   Cardiovascular: Positive for leg swelling.  Gastrointestinal: Negative.   Musculoskeletal: Negative.   Skin: Negative.   Neurological: Negative.   Hematological: Negative.   Psychiatric/Behavioral: Negative.   All other systems reviewed and are negative.    BP 135/81  Pulse 78  Ht 5\' 8"  (1.727 m)  Wt 163 lb (73.936 kg)  BMI 24.78 kg/m2  Physical Exam  Nursing note and vitals reviewed. Constitutional:  She is oriented to person, place, and time. She appears well-developed and well-nourished.  HENT:  Head: Normocephalic.  Nose: Nose normal.  Mouth/Throat: Oropharynx is clear and moist.  Eyes: Conjunctivae are normal. Pupils are equal, round, and reactive to light.  Neck: Normal range of motion. Neck supple. No JVD present.  Cardiovascular: Normal rate, regular rhythm, S1 normal, S2 normal and intact distal pulses.  Exam reveals no gallop and no friction rub.   Murmur heard.  Crescendo systolic murmur is present with a grade of 2/6       1+ pitting edema in the right lower extremity, trace to 1+ pitting edema on the left  Pulmonary/Chest: Effort normal and breath sounds normal. No respiratory distress. She has no wheezes. She has no rales. She exhibits no tenderness.  Abdominal: Soft. Bowel sounds are normal. She exhibits no distension. There is no tenderness.  Musculoskeletal: Normal range  of motion. She exhibits no edema and no tenderness.  Lymphadenopathy:    She has no cervical adenopathy.  Neurological: She is alert and oriented to person, place, and time. Coordination normal.  Skin: Skin is warm and dry. No rash noted. No erythema.  Psychiatric: She has a normal mood and affect. Her behavior is normal. Judgment and thought content normal.         Assessment and Plan

## 2012-02-09 NOTE — Assessment & Plan Note (Signed)
Previous baseline creatinine 1.5 in the hospital.

## 2012-02-09 NOTE — Patient Instructions (Signed)
You are doing well. You have leg swelling today, worse on the right Please take lasix twice a day (one in the AM and after lunch) when you have swelling Monitor your weight and call the office if your weight goes up  Please call us if you have new issues that need to be addressed before your next appt.  Your physician wants you to follow-up in: 6 months.  You will receive a reminder letter in the mail two months in advance. If you don't receive a letter, please call our office to schedule the follow-up appointment.

## 2012-02-09 NOTE — Assessment & Plan Note (Signed)
Blood pressure is well controlled on today's visit. No changes made to the medications. 

## 2012-02-09 NOTE — Assessment & Plan Note (Signed)
ICD in place. °

## 2012-07-17 ENCOUNTER — Encounter: Payer: Medicare Other | Admitting: Internal Medicine

## 2012-07-24 ENCOUNTER — Ambulatory Visit (INDEPENDENT_AMBULATORY_CARE_PROVIDER_SITE_OTHER): Payer: Medicare Other | Admitting: Cardiovascular Disease

## 2012-07-24 ENCOUNTER — Encounter: Payer: Self-pay | Admitting: Cardiovascular Disease

## 2012-07-24 VITALS — BP 158/79 | HR 83 | Ht 68.0 in | Wt 163.8 lb

## 2012-07-24 DIAGNOSIS — I472 Ventricular tachycardia: Secondary | ICD-10-CM

## 2012-07-24 DIAGNOSIS — I429 Cardiomyopathy, unspecified: Secondary | ICD-10-CM

## 2012-07-24 DIAGNOSIS — Z95810 Presence of automatic (implantable) cardiac defibrillator: Secondary | ICD-10-CM | POA: Insufficient documentation

## 2012-07-24 DIAGNOSIS — I4949 Other premature depolarization: Secondary | ICD-10-CM

## 2012-07-24 DIAGNOSIS — Z950 Presence of cardiac pacemaker: Secondary | ICD-10-CM

## 2012-07-24 DIAGNOSIS — I10 Essential (primary) hypertension: Secondary | ICD-10-CM

## 2012-07-24 NOTE — Assessment & Plan Note (Signed)
Blood pressure is well controlled on today's visit. No changes made to the medications. 

## 2012-07-24 NOTE — Assessment & Plan Note (Signed)
She is scheduled to see Dr. Graciela Husbands next month, November 2013.

## 2012-07-24 NOTE — Patient Instructions (Addendum)
You are doing well. No medication changes were made.  Please call us if you have new issues that need to be addressed before your next appt.  Your physician wants you to follow-up in: 6 months.  You will receive a reminder letter in the mail two months in advance. If you don't receive a letter, please call our office to schedule the follow-up appointment.   

## 2012-07-24 NOTE — Progress Notes (Signed)
Patient ID: [name], female    DOB: 03-Oct-1926, 78 y.o.   MRN: [id number]  HPI Comments: [name] is an 78 year old woman with a history of nonischemic cardiomyopathy, complicated by congestive heart failure, diabetes , gout , outside echocardiogram suggesting moderate to severe mitral valve regurgitation, ejection fraction 55% in December 2012. She is status post CRT implantation.  history of nonsustained ventricular tachycardia but no therapeutic interventions via her ICD .  She was in the hospital in December 2012 with MRSA pneumonia, acute on chronic renal dysfunction    She takes Lasix twice a day.  No significant edema or shortness of breath . She drinks significant water and diet Coke. Otherwise she has no complaints apart from her hearing. She lost her hearing aid while in the hospital. She is saving up for a new one. Her gait is relatively unstable. She does not do any regular exercise. No other new complaints on today's visit.  Chest x-ray in the hospital December 2012 shows peripheral infiltrate in the right lower lobe consistent with pneumonia  Labs March 2012 Total cholesterol 73, LDL 27, HDL 20  EKG shows paced rhythm at 83 beats per minute   Outpatient Encounter Prescriptions as of 07/24/2012  Medication Sig Dispense Refill  . acetaminophen (TYLENOL ARTHRITIS PAIN) 650 MG CR tablet Take 650 mg by mouth every 8 (eight) hours as needed.        Marland Kitchen albuterol (PROAIR HFA) 108 (90 BASE) MCG/ACT inhaler Inhale 2 puffs into the lungs every 6 (six) hours as needed.        Marland Kitchen aspirin 81 MG EC tablet Take 81 mg by mouth daily.        Marland Kitchen atorvastatin (LIPITOR) 40 MG tablet Take 40 mg by mouth daily.        . Calcium Carbonate-Vitamin D (CALCIUM-VITAMIN D) 600-200 MG-UNIT CAPS Take 1 capsule by mouth daily.        . carvedilol (COREG) 3.125 MG tablet Take 3.125 mg by mouth 2 (two) times daily with a meal.      . Ferrous Sulfate (IRON) 325 (65 FE) MG TABS Take 65 mg by mouth daily.      .  furosemide (LASIX) 20 MG tablet Take 20 mg by mouth 2 (two) times daily as needed.        . gabapentin (NEURONTIN) 100 MG capsule Take 100 mg by mouth daily.      Marland Kitchen glipiZIDE (GLUCOTROL) 10 MG tablet Take 10 mg by mouth daily.        Marland Kitchen levothyroxine (SYNTHROID, LEVOTHROID) 125 MCG tablet Take 125 mcg by mouth daily.      . meclizine (ANTIVERT) 25 MG tablet Take 25 mg by mouth daily.        Marland Kitchen nystatin (NYSTOP) 100000 UNIT/GM POWD Apply topically as needed.        Marland Kitchen omeprazole (PRILOSEC) 20 MG capsule Take 20 mg by mouth daily.        Marland Kitchen tolterodine (DETROL LA) 4 MG 24 hr capsule Take 4 mg by mouth daily.        . Triamcinolone Acetonide (TRIAMCINOLONE 0.1 % CREAM : EUCERIN) CREA Apply 1 application topically as needed.      . [DISCONTINUED] carvedilol (COREG) 25 MG tablet Take 25 mg by mouth 2 (two) times daily with a meal.       . [DISCONTINUED] ciprofloxacin (CIPRO XR) 500 MG 24 hr tablet Take 500 mg by mouth daily. Take one tablet everyday until gone.       . [  DISCONTINUED] levothyroxine (SYNTHROID, LEVOTHROID) 137 MCG tablet Take 137 mcg by mouth daily.           Review of Systems  Constitutional: Negative.   HENT: Negative.   Eyes: Negative.   Gastrointestinal: Negative.   Musculoskeletal: Positive for gait problem.  Skin: Negative.   Neurological: Negative.   Hematological: Negative.   Psychiatric/Behavioral: Negative.   All other systems reviewed and are negative.    BP 158/79  Pulse 83  Ht 5\' 8"  (1.727 m)  Wt 163 lb 12 oz (74.277 kg)  BMI 24.90 kg/m2  Physical Exam  Nursing note and vitals reviewed. Constitutional: She is oriented to person, place, and time. She appears well-developed and well-nourished.  HENT:  Head: Normocephalic.  Nose: Nose normal.  Mouth/Throat: Oropharynx is clear and moist.  Eyes: Conjunctivae normal are normal. Pupils are equal, round, and reactive to light.  Neck: Normal range of motion. Neck supple. No JVD present.  Cardiovascular: Normal  rate, regular rhythm, S1 normal, S2 normal and intact distal pulses.  Exam reveals no gallop and no friction rub (Trace ).   Murmur heard.  Crescendo systolic murmur is present with a grade of 2/6       Trace pitting edema of the ankles  Pulmonary/Chest: Effort normal and breath sounds normal. No respiratory distress. She has no wheezes. She has no rales. She exhibits no tenderness.  Abdominal: Soft. Bowel sounds are normal. She exhibits no distension. There is no tenderness.  Musculoskeletal: Normal range of motion. She exhibits no edema and no tenderness.  Lymphadenopathy:    She has no cervical adenopathy.  Neurological: She is alert and oriented to person, place, and time. Coordination normal.  Skin: Skin is warm and dry. No rash noted. No erythema.  Psychiatric: She has a normal mood and affect. Her behavior is normal. Judgment and thought content normal.         Assessment and Plan

## 2012-07-24 NOTE — Assessment & Plan Note (Signed)
CHF appears relatively stable on her current medication regimen. Minimal edema, no shortness of breath. No medication changes made.

## 2012-08-14 ENCOUNTER — Ambulatory Visit (INDEPENDENT_AMBULATORY_CARE_PROVIDER_SITE_OTHER): Payer: Medicare Other | Admitting: Internal Medicine

## 2012-08-14 ENCOUNTER — Encounter: Payer: Self-pay | Admitting: Internal Medicine

## 2012-08-14 VITALS — BP 142/70 | HR 85 | Ht 68.0 in | Wt 169.2 lb

## 2012-08-14 DIAGNOSIS — Z950 Presence of cardiac pacemaker: Secondary | ICD-10-CM

## 2012-08-14 DIAGNOSIS — I5022 Chronic systolic (congestive) heart failure: Secondary | ICD-10-CM

## 2012-08-14 DIAGNOSIS — I429 Cardiomyopathy, unspecified: Secondary | ICD-10-CM

## 2012-08-14 DIAGNOSIS — I472 Ventricular tachycardia: Secondary | ICD-10-CM

## 2012-08-14 LAB — PACEMAKER DEVICE OBSERVATION
AL AMPLITUDE: 1.875 mv
AL IMPEDENCE PM: 456 Ohm
AL THRESHOLD: 0.875 V
BATTERY VOLTAGE: 2.9391 V
DEV-0020ICD: NEGATIVE
RV LEAD AMPLITUDE: 7.125 mv
RV LEAD IMPEDENCE PM: 532 Ohm

## 2012-08-14 NOTE — Assessment & Plan Note (Signed)
No intercurrent Ventricular tachycardia  

## 2012-08-14 NOTE — Progress Notes (Signed)
Patient Care Team: Dorothey Baseman as PCP - General (Family Medicine)   HPI  [name] is a 78 y.o. female With a history of nonischemic cardiomyopathy previously post ICD implantation changed out for CRT P. 2012. Echocardiogram 2012 demonstrated near-normal left ventricular function severe mitral regurgitation  Functionally she is stable. Her biggest concern is that she has been called for jury duty    Past Medical History  Diagnosis Date  . Chronic systolic heart failure   . Secondary cardiomyopathy, unspecified   . Pacemaker     CRTP  . Chest pain, unspecified   . Unspecified essential hypertension   . Paroxysmal ventricular tachycardia   . Unspecified disorder resulting from impaired renal function     Past Surgical History  Procedure Date  . Insert / replace / remove pacemaker     Current Outpatient Prescriptions  Medication Sig Dispense Refill  . acetaminophen (TYLENOL ARTHRITIS PAIN) 650 MG CR tablet Take 650 mg by mouth every 8 (eight) hours as needed.        Marland Kitchen albuterol (PROAIR HFA) 108 (90 BASE) MCG/ACT inhaler Inhale 2 puffs into the lungs every 6 (six) hours as needed.        Marland Kitchen aspirin 81 MG EC tablet Take 81 mg by mouth daily.        Marland Kitchen atorvastatin (LIPITOR) 40 MG tablet Take 40 mg by mouth daily.        . Calcium Carbonate-Vitamin D (CALCIUM-VITAMIN D) 600-200 MG-UNIT CAPS Take 1 capsule by mouth daily.        . carvedilol (COREG) 3.125 MG tablet Take 3.125 mg by mouth 2 (two) times daily with a meal.      . Ferrous Sulfate (IRON) 325 (65 FE) MG TABS Take 65 mg by mouth daily.      . furosemide (LASIX) 20 MG tablet Take 20 mg by mouth 2 (two) times daily as needed.        . gabapentin (NEURONTIN) 100 MG capsule Take 100 mg by mouth daily.      Marland Kitchen glipiZIDE (GLUCOTROL) 10 MG tablet Take 10 mg by mouth daily.        Marland Kitchen levothyroxine (SYNTHROID, LEVOTHROID) 125 MCG tablet Take 125 mcg by mouth daily.      . meclizine (ANTIVERT) 25 MG tablet Take 25 mg by mouth  daily.        Marland Kitchen nystatin (NYSTOP) 100000 UNIT/GM POWD Apply topically as needed.        Marland Kitchen omeprazole (PRILOSEC) 20 MG capsule Take 20 mg by mouth daily.        Marland Kitchen tolterodine (DETROL LA) 4 MG 24 hr capsule Take 4 mg by mouth daily.        . Triamcinolone Acetonide (TRIAMCINOLONE 0.1 % CREAM : EUCERIN) CREA Apply 1 application topically as needed.        Allergies  Allergen Reactions  . Penicillins     Review of Systems negative except from HPI and PMH  Physical Exam BP 142/70  Pulse 85  Ht 5\' 8"  (1.727 m)  Wt 169 lb 4 oz (76.771 kg)  BMI 25.73 kg/m2 Well developed and nourished in no acute distress HENT normal Neck supple with JVP-flat Device pocket well healed; without hematoma or erythema  Clear Regular rate and rhythm, no murmurs or gallops Abd-soft with active BS without hepatomegaly No Clubbing cyanosis edema Skin-warm and dry A & Oriented  Grossly normal sensory and motor function Differential   Assessment and  Plan

## 2012-08-14 NOTE — Assessment & Plan Note (Signed)
The patient's device was interrogated.  The information was reviewed. No changes were made in the programming.    

## 2012-08-14 NOTE — Patient Instructions (Addendum)
Your physician wants you to follow-up in: 6 months for device clinic and 1 year with Dr. Graciela Husbands. You will receive a reminder letter in the mail two months in advance. If you don't receive a letter, please call our office to schedule the follow-up appointment.

## 2012-08-14 NOTE — Assessment & Plan Note (Signed)
Stable on current meds 

## 2013-02-04 ENCOUNTER — Other Ambulatory Visit: Payer: Self-pay | Admitting: Gastroenterology

## 2013-02-04 DIAGNOSIS — R1310 Dysphagia, unspecified: Secondary | ICD-10-CM

## 2013-02-04 DIAGNOSIS — R1110 Vomiting, unspecified: Secondary | ICD-10-CM

## 2013-02-05 ENCOUNTER — Ambulatory Visit
Admission: RE | Admit: 2013-02-05 | Discharge: 2013-02-05 | Disposition: A | Discharge status: Home / Self Care | Payer: Medicare Other | Source: Ambulatory Visit | Attending: Gastroenterology | Admitting: Gastroenterology

## 2013-02-05 ENCOUNTER — Other Ambulatory Visit: Payer: Self-pay | Admitting: Gastroenterology

## 2013-02-05 DIAGNOSIS — R1310 Dysphagia, unspecified: Secondary | ICD-10-CM

## 2013-02-05 DIAGNOSIS — R1110 Vomiting, unspecified: Secondary | ICD-10-CM

## 2013-02-18 ENCOUNTER — Ambulatory Visit: Payer: Medicare Other | Admitting: Cardiovascular Disease

## 2013-02-26 IMAGING — CR DG CHEST 2V
1 series · 3 of 3 positions shown · non-contrast
Comparison: none

REASON FOR EXAM: htn,chest pain
COMMENTS:

[Series 1: view not recorded · 0.17mm/px · 3 of 3 slices shown]
[im 1/3]
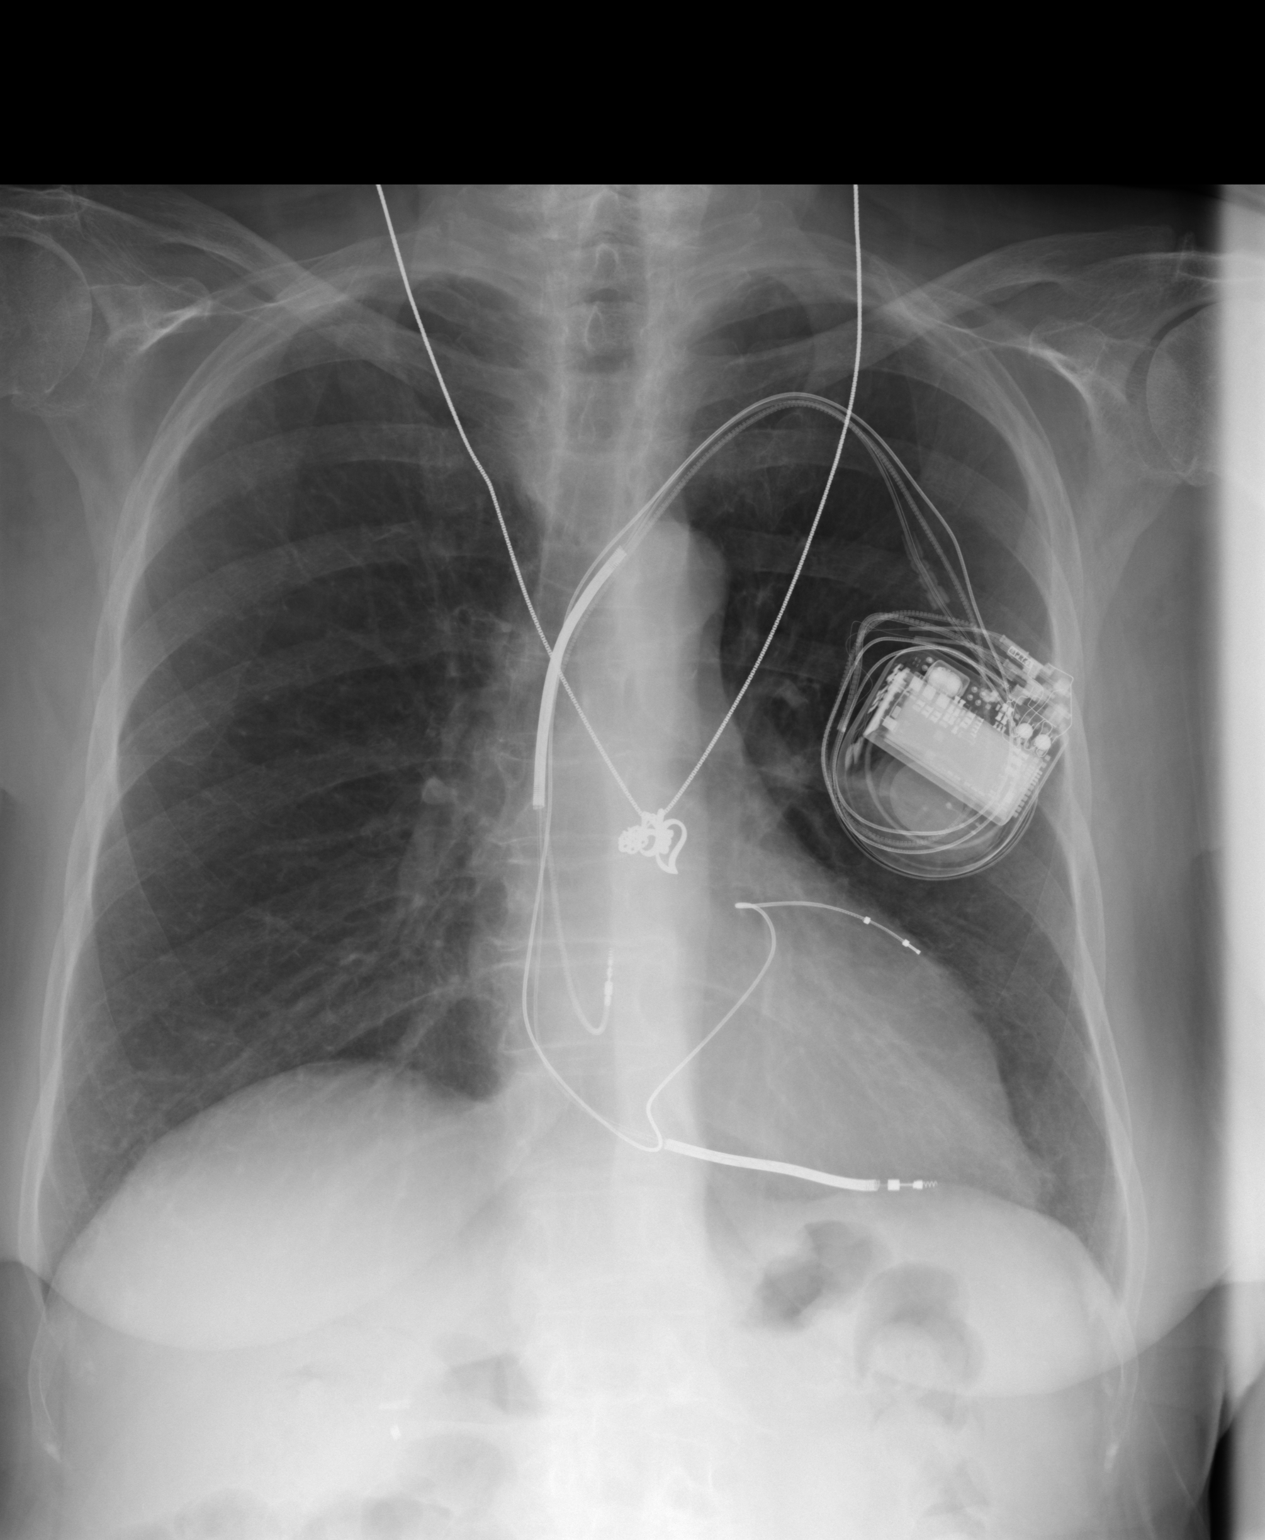
[im 2/3]
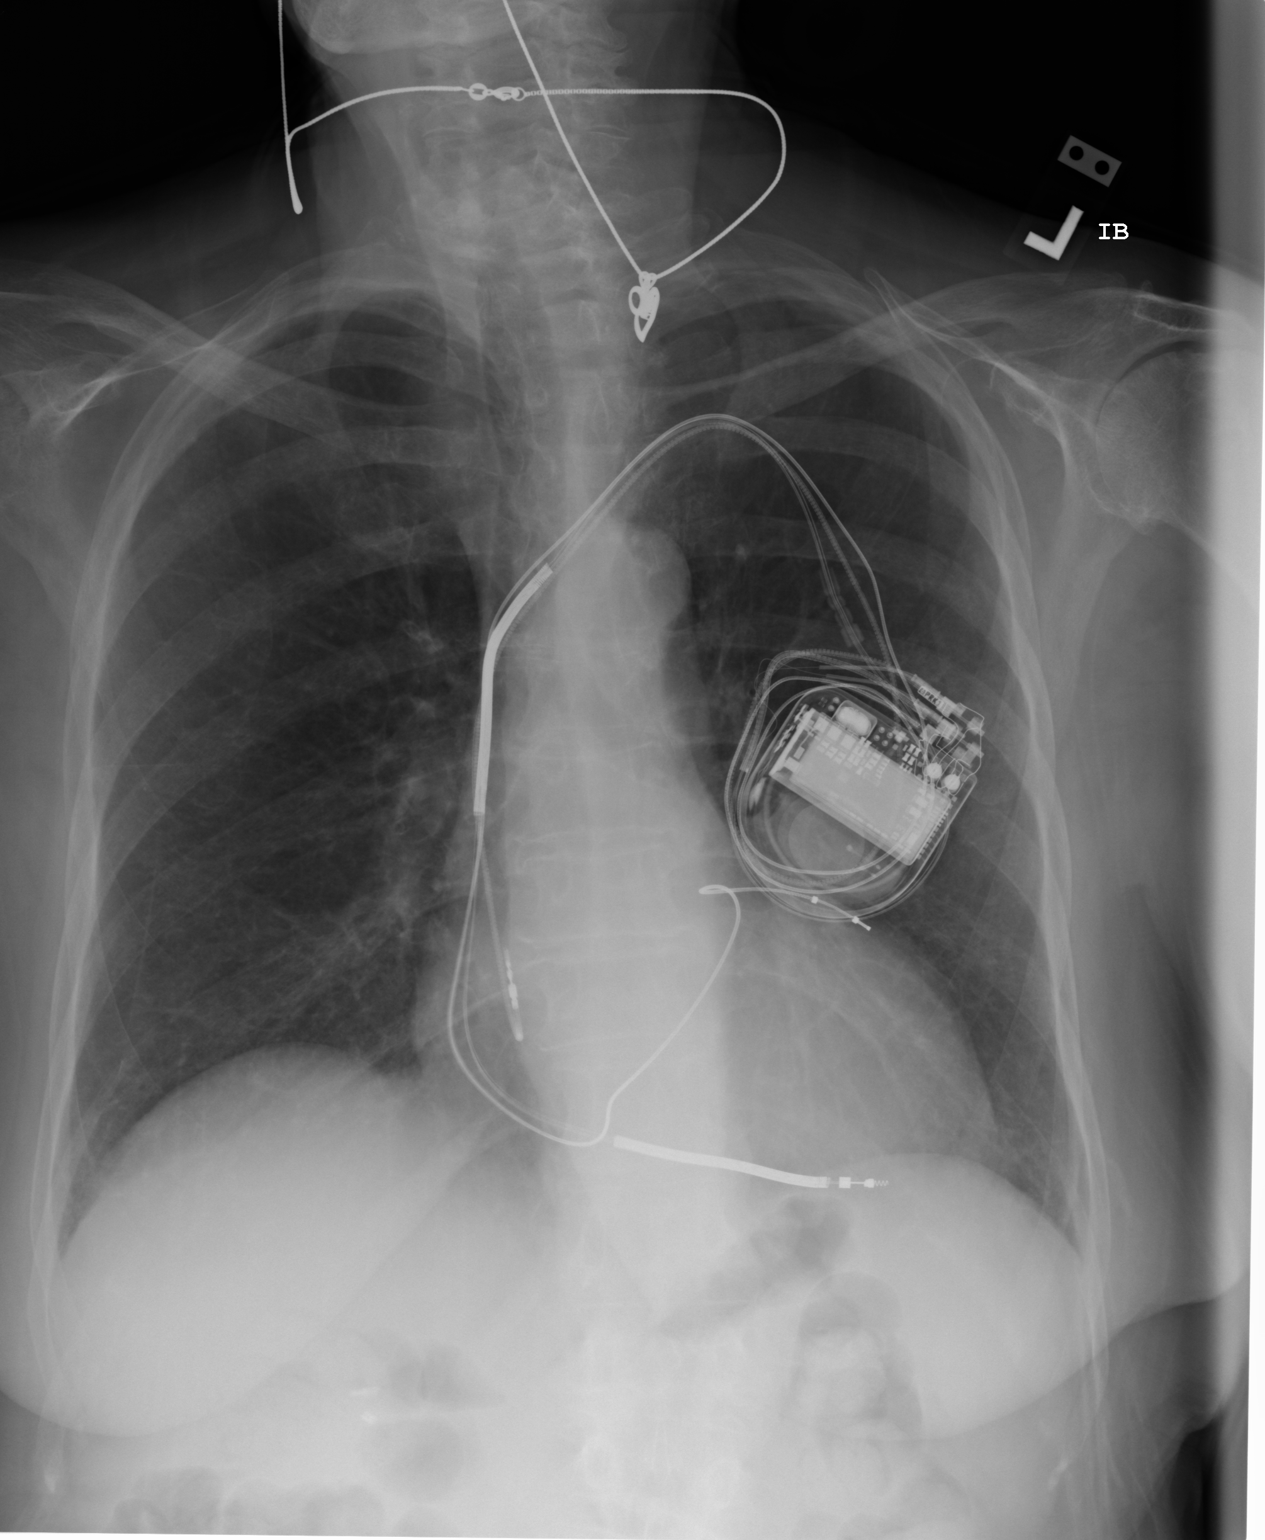
[im 3/3]
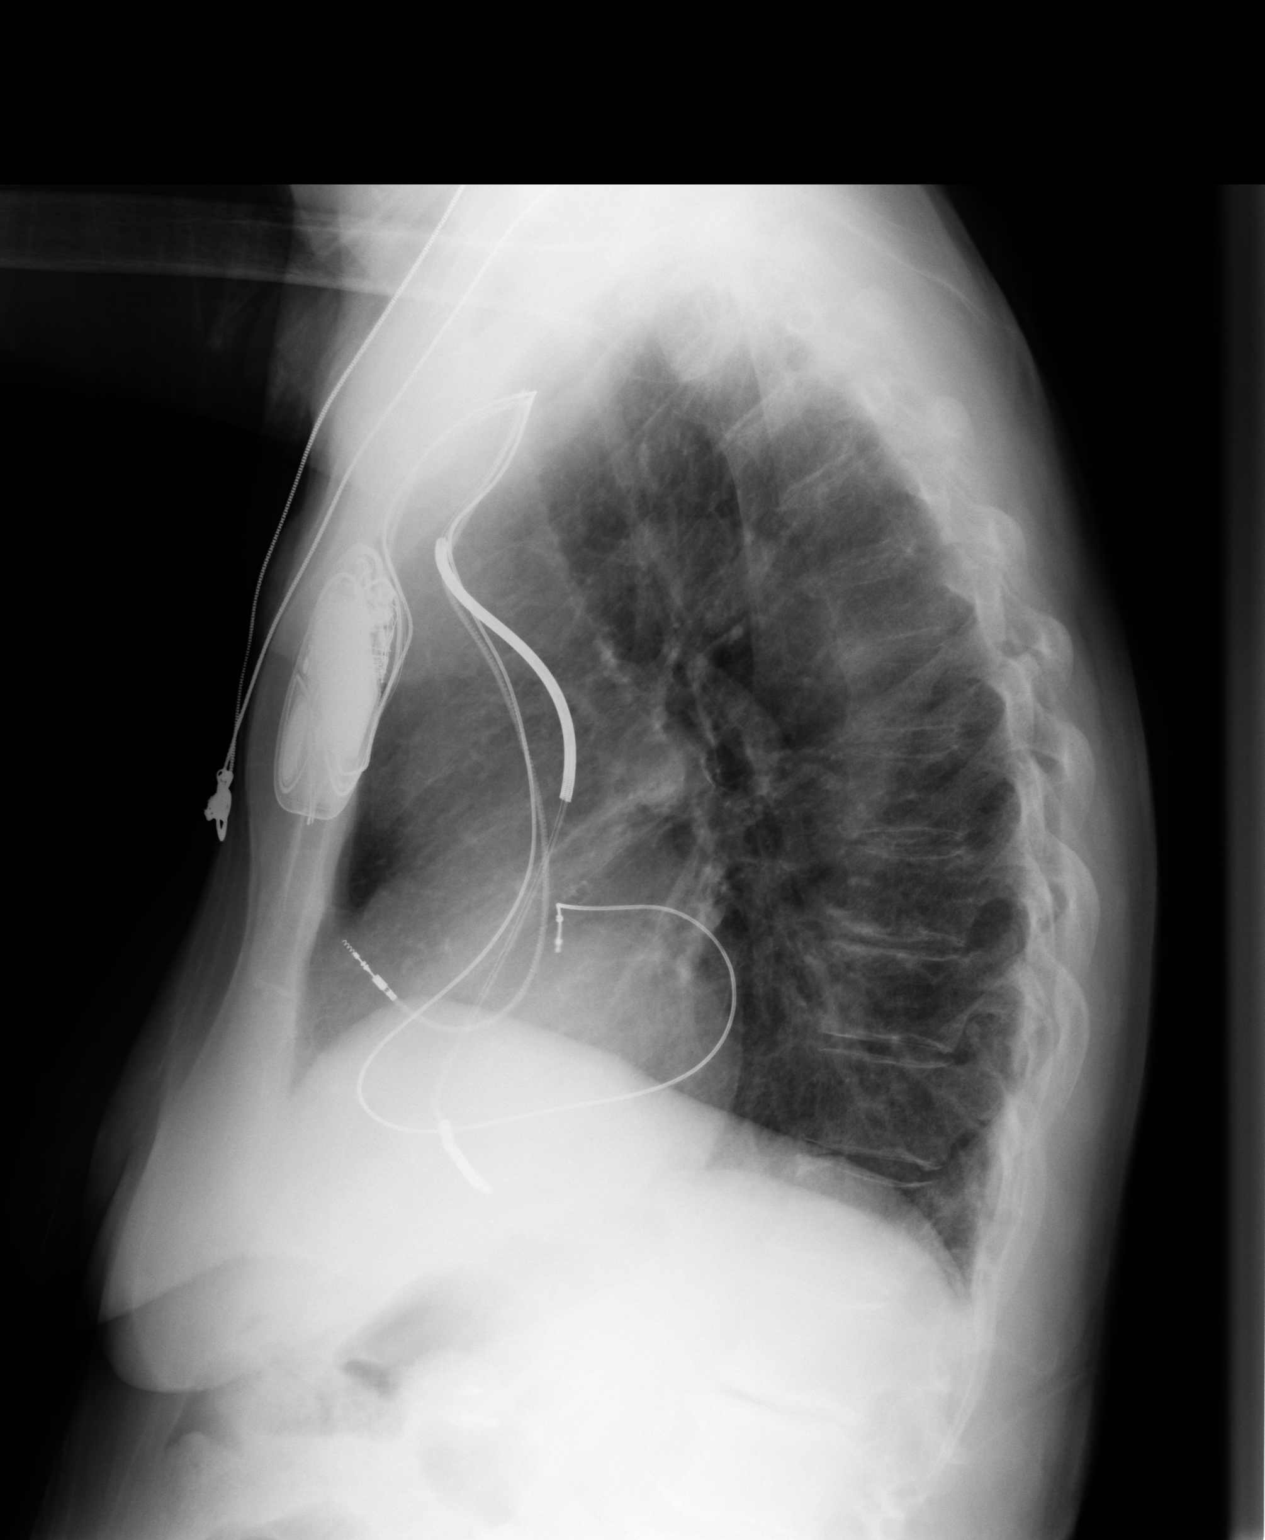

[3 of 3 positions shown; findings below may reference images not displayed]

PROCEDURE:     DXR - DXR CHEST PA (OR AP) AND LATERAL  - November 17, 2010  [DATE]

RESULT:     Comparison is made to the prior exam of 05/26/2007.

The lung fields are clear. No pneumonia, pneumothorax or pleural effusion is
seen. There is no interstitial or pulmonary edema. The heart size is within
normal limits. A cardiac pacemaker is present.
IMPRESSION: No acute changes are identified.

## 2013-03-06 ENCOUNTER — Encounter: Payer: Self-pay | Admitting: Internal Medicine

## 2013-03-06 ENCOUNTER — Ambulatory Visit (INDEPENDENT_AMBULATORY_CARE_PROVIDER_SITE_OTHER): Payer: Medicare Other | Admitting: *Deleted

## 2013-03-06 DIAGNOSIS — I429 Cardiomyopathy, unspecified: Secondary | ICD-10-CM

## 2013-03-06 DIAGNOSIS — I5022 Chronic systolic (congestive) heart failure: Secondary | ICD-10-CM

## 2013-03-06 DIAGNOSIS — I472 Ventricular tachycardia: Secondary | ICD-10-CM

## 2013-03-06 LAB — PACEMAKER DEVICE OBSERVATION
AL AMPLITUDE: 1.75 mv
ATRIAL PACING PM: 0.31
LV LEAD THRESHOLD: 2.75 V
RV LEAD THRESHOLD: 0.75 V
VENTRICULAR PACING PM: 88.21

## 2013-03-06 MED ORDER — FUROSEMIDE 20 MG PO TABS: 20.0000 mg | ORAL_TABLET | Freq: Every day | ORAL | Status: DC

## 2013-03-06 NOTE — Progress Notes (Signed)
PPM check with ICM in office.

## 2013-03-11 ENCOUNTER — Ambulatory Visit (INDEPENDENT_AMBULATORY_CARE_PROVIDER_SITE_OTHER): Payer: Medicare Other | Admitting: Cardiovascular Disease

## 2013-03-11 ENCOUNTER — Other Ambulatory Visit: Payer: Self-pay | Admitting: *Deleted

## 2013-03-11 ENCOUNTER — Encounter: Payer: Self-pay | Admitting: Cardiovascular Disease

## 2013-03-11 VITALS — BP 151/62 | HR 71 | Ht 68.0 in | Wt 171.5 lb

## 2013-03-11 DIAGNOSIS — N259 Disorder resulting from impaired renal tubular function, unspecified: Secondary | ICD-10-CM

## 2013-03-11 DIAGNOSIS — I509 Heart failure, unspecified: Secondary | ICD-10-CM

## 2013-03-11 DIAGNOSIS — Z950 Presence of cardiac pacemaker: Secondary | ICD-10-CM

## 2013-03-11 DIAGNOSIS — I10 Essential (primary) hypertension: Secondary | ICD-10-CM

## 2013-03-11 DIAGNOSIS — I5033 Acute on chronic diastolic (congestive) heart failure: Secondary | ICD-10-CM

## 2013-03-11 DIAGNOSIS — I472 Ventricular tachycardia, unspecified: Secondary | ICD-10-CM

## 2013-03-11 DIAGNOSIS — I5030 Unspecified diastolic (congestive) heart failure: Secondary | ICD-10-CM | POA: Insufficient documentation

## 2013-03-11 MED ORDER — FUROSEMIDE 20 MG PO TABS: 20.0000 mg | ORAL_TABLET | Freq: Two times a day (BID) | ORAL | Status: AC

## 2013-03-11 MED ORDER — ATORVASTATIN CALCIUM 40 MG PO TABS: 40.0000 mg | ORAL_TABLET | Freq: Every day | ORAL | Status: AC

## 2013-03-11 MED ORDER — CARVEDILOL 3.125 MG PO TABS: 25.0000 mg | ORAL_TABLET | Freq: Two times a day (BID) | ORAL | Status: AC

## 2013-03-11 MED ORDER — POTASSIUM CHLORIDE ER 10 MEQ PO TBCR: 10.0000 meq | EXTENDED_RELEASE_TABLET | Freq: Every day | ORAL | Status: DC

## 2013-03-11 NOTE — Assessment & Plan Note (Addendum)
Blood pressure is elevated. We'll restart Lasix twice a day. This will likely help with her blood pressure.

## 2013-03-11 NOTE — Progress Notes (Signed)
Patient ID: [name], female    DOB: 03-16-1927, 79 y.o.   MRN: [id number]  HPI Comments: [name] is an 79 year old woman with a history of nonischemic cardiomyopathy, complicated by congestive heart failure, diabetes , gout , outside echocardiogram suggesting moderate to severe mitral valve regurgitation (not confirmed), ejection fraction 55% in December 2012. She is status post CRT implantation.  history of nonsustained ventricular tachycardia but no therapeutic interventions via her ICD .  She was in the hospital in December 2012 with MRSA pneumonia, acute on chronic renal dysfunction  She was previously taking Lasix 20 mg twice a day. She states having difficulty getting the refill from her  primary care office and stopped taking this over one month ago. Since then she has had worsening lower extremity edema. She also has abdominal fullness. Very hard of hearing. Family helps to take care of her. She drinks significant water and diet Coke.  Her gait is relatively unstable. She does not do any regular exercise. No other new complaints on today's visit.  Chest x-ray in the hospital December 2012 shows peripheral infiltrate in the right lower lobe consistent with pneumonia  Labs March 2012 Total cholesterol 73, LDL 27, HDL 20  EKG shows paced rhythm at 71 beats per minute   Outpatient Encounter Prescriptions as of 03/11/2013  Medication Sig Dispense Refill  . acetaminophen (TYLENOL ARTHRITIS PAIN) 650 MG CR tablet Take 650 mg by mouth every 8 (eight) hours as needed.        Marland Kitchen albuterol (PROAIR HFA) 108 (90 BASE) MCG/ACT inhaler Inhale 2 puffs into the lungs every 6 (six) hours as needed.        . Calcium Carbonate-Vitamin D (CALCIUM-VITAMIN D) 600-200 MG-UNIT CAPS Take 1 capsule by mouth daily.        . cephALEXin (KEFLEX) 500 MG capsule Take 500 mg by mouth 3 (three) times daily.      . furosemide (LASIX) 20 MG tablet Take 1 tablet (20 mg total) by mouth 2 (two) times daily.  60 tablet   11  . gabapentin (NEURONTIN) 100 MG capsule Take 100 mg by mouth daily.      Marland Kitchen glipiZIDE (GLUCOTROL) 10 MG tablet Take 10 mg by mouth daily.        Marland Kitchen HYDROcodone-acetaminophen (NORCO) 7.5-325 MG per tablet Take 1 tablet by mouth every 12 (twelve) hours as needed for pain.      Marland Kitchen levothyroxine (SYNTHROID, LEVOTHROID) 125 MCG tablet Take 125 mcg by mouth daily.      Marland Kitchen nystatin (NYSTOP) 100000 UNIT/GM POWD Apply topically as needed.       Marland Kitchen omeprazole (PRILOSEC) 20 MG capsule Take 20 mg by mouth daily.        Marland Kitchen tolterodine (DETROL LA) 4 MG 24 hr capsule Take 4 mg by mouth daily.        . Triamcinolone Acetonide (TRIAMCINOLONE 0.1 % CREAM : EUCERIN) CREA Apply 1 application topically as needed.      . [DISCONTINUED] atorvastatin (LIPITOR) 40 MG tablet Take 40 mg by mouth daily.        . [DISCONTINUED] carvedilol (COREG) 3.125 MG tablet Take 25 mg by mouth 2 (two) times daily with a meal.       . [DISCONTINUED] furosemide (LASIX) 20 MG tablet Take 1 tablet (20 mg total) by mouth daily.  30 tablet  12  . potassium chloride (K-DUR) 10 MEQ tablet Take 1 tablet (10 mEq total) by mouth daily.  30 tablet  3   No facility-administered encounter medications on file as of 03/11/2013.     Review of Systems  Constitutional: Negative.   HENT: Positive for hearing loss.   Eyes: Negative.   Respiratory: Positive for shortness of breath.   Cardiovascular: Positive for leg swelling.  Gastrointestinal: Negative.   Musculoskeletal: Negative.   Skin: Negative.   Neurological: Negative.   Psychiatric/Behavioral: Negative.   All other systems reviewed and are negative.    BP 151/62  Pulse 71  Ht 5\' 8"  (1.727 m)  Wt 171 lb 8 oz (77.792 kg)  BMI 26.08 kg/m2  Physical Exam  Nursing note and vitals reviewed. Constitutional: She is oriented to person, place, and time. She appears well-developed and well-nourished.  HENT:  Head: Normocephalic.  Nose: Nose normal.  Mouth/Throat: Oropharynx is clear and  moist.  Eyes: Conjunctivae are normal. Pupils are equal, round, and reactive to light.  Neck: Normal range of motion. Neck supple. No JVD present.  Cardiovascular: Normal rate, regular rhythm, S1 normal, S2 normal and intact distal pulses.  Exam reveals no gallop and no friction rub (Trace ).   Murmur heard.  Crescendo systolic murmur is present with a grade of 2/6  2+ pitting edema to the mid shins  Pulmonary/Chest: Effort normal and breath sounds normal. No respiratory distress. She has no wheezes. She has no rales. She exhibits no tenderness.  Abdominal: Soft. Bowel sounds are normal. She exhibits no distension. There is no tenderness.  Musculoskeletal: Normal range of motion. She exhibits no edema and no tenderness.  Lymphadenopathy:    She has no cervical adenopathy.  Neurological: She is alert and oriented to person, place, and time. Coordination normal.  Skin: Skin is warm and dry. No rash noted. No erythema.  Psychiatric: She has a normal mood and affect. Her behavior is normal. Judgment and thought content normal.    Assessment and Plan

## 2013-03-11 NOTE — Assessment & Plan Note (Signed)
ICD in place. No shocks delivered

## 2013-03-11 NOTE — Assessment & Plan Note (Signed)
Followed by EP 

## 2013-03-11 NOTE — Patient Instructions (Addendum)
For your leg swelling,  Please restart furosemide/lasix twice a day Take with a potassium once a day Decrease you fluid intake  Please call us if you have new issues that need to be addressed before your next appt.  Your physician wants you to follow-up in: 2 months.

## 2013-03-11 NOTE — Assessment & Plan Note (Addendum)
She stopped taking her Lasix over one month ago now with weight gain, worsening lower extremity edema into her abdomen. We have suggested she restart her Lasix 20 mg twice a day. Would also recommend she decrease her fluid intake, particularly her sodas. We will also start potassium 10 mEq daily.

## 2013-03-11 NOTE — Assessment & Plan Note (Addendum)
We have suggested we repeat  metabolic panel in followup

## 2013-03-11 NOTE — Telephone Encounter (Signed)
Refilled Carvedilol and Lipitor sent to [facility].

## 2013-05-08 ENCOUNTER — Ambulatory Visit: Payer: Medicare Other | Admitting: Cardiovascular Disease

## 2013-06-05 ENCOUNTER — Emergency Department: Payer: Self-pay | Admitting: Emergency Medicine

## 2013-07-12 ENCOUNTER — Other Ambulatory Visit: Payer: Self-pay | Admitting: *Deleted

## 2013-07-12 MED ORDER — POTASSIUM CHLORIDE ER 10 MEQ PO TBCR: 10.0000 meq | EXTENDED_RELEASE_TABLET | Freq: Every day | ORAL | Status: AC

## 2013-07-12 NOTE — Telephone Encounter (Signed)
Requested Prescriptions   Signed Prescriptions Disp Refills  . potassium chloride (K-DUR) 10 MEQ tablet 30 tablet 3    Sig: Take 1 tablet (10 mEq total) by mouth daily.    Authorizing Provider: GOLLAN, TIMOTHY J    Ordering User: Laverne Klugh C    

## 2013-08-28 ENCOUNTER — Encounter: Payer: Self-pay | Admitting: *Deleted

## 2013-12-23 ENCOUNTER — Encounter: Payer: Self-pay | Admitting: *Deleted

## 2014-02-15 ENCOUNTER — Other Ambulatory Visit: Payer: Self-pay | Admitting: Cardiovascular Disease

## 2014-02-26 ENCOUNTER — Encounter: Payer: Self-pay | Admitting: *Deleted

## 2014-02-27 ENCOUNTER — Telehealth: Payer: Self-pay | Admitting: Cardiology

## 2014-02-27 NOTE — Telephone Encounter (Signed)
Certified letter sent 

## 2014-03-13 ENCOUNTER — Inpatient Hospital Stay: Payer: Self-pay | Admitting: Internal Medicine

## 2014-03-13 LAB — URINALYSIS, COMPLETE
Glucose,UR: NEGATIVE mg/dL (ref 0–75)
Hyaline Cast: 2
Ketone: NEGATIVE
Nitrite: NEGATIVE
PH: 7 (ref 4.5–8.0)
Protein: NEGATIVE
Specific Gravity: 1.01 (ref 1.003–1.030)
Squamous Epithelial: NONE SEEN
WBC UR: 17 /HPF (ref 0–5)

## 2014-03-13 LAB — BASIC METABOLIC PANEL
Anion Gap: 10 (ref 7–16)
BUN: 51 mg/dL — ABNORMAL HIGH (ref 7–18)
CALCIUM: 8.1 mg/dL — AB (ref 8.5–10.1)
CREATININE: 2.93 mg/dL — AB (ref 0.60–1.30)
Chloride: 116 mmol/L — ABNORMAL HIGH (ref 98–107)
Co2: 18 mmol/L — ABNORMAL LOW (ref 21–32)
Glucose: 150 mg/dL — ABNORMAL HIGH (ref 65–99)
Osmolality: 303 (ref 275–301)
Potassium: 4.4 mmol/L (ref 3.5–5.1)
SODIUM: 144 mmol/L (ref 136–145)

## 2014-03-13 LAB — CK TOTAL AND CKMB (NOT AT ARMC)
CK, Total: 362 U/L — ABNORMAL HIGH
CK-MB: 39.4 ng/mL — ABNORMAL HIGH (ref 0.5–3.6)

## 2014-03-13 LAB — CBC WITH DIFFERENTIAL/PLATELET
BASOS ABS: 0 10*3/uL (ref 0.0–0.1)
Basophil %: 0.1 %
EOS PCT: 0.1 %
Eosinophil #: 0 10*3/uL (ref 0.0–0.7)
HCT: 34.3 % — ABNORMAL LOW (ref 35.0–47.0)
HGB: 11.1 g/dL — ABNORMAL LOW (ref 12.0–16.0)
LYMPHS ABS: 0.6 10*3/uL — AB (ref 1.0–3.6)
Lymphocyte %: 5.9 %
MCH: 33 pg (ref 26.0–34.0)
MCHC: 32.5 g/dL (ref 32.0–36.0)
MCV: 102 fL — ABNORMAL HIGH (ref 80–100)
MONO ABS: 0.3 x10 3/mm (ref 0.2–0.9)
Monocyte %: 2.9 %
Neutrophil #: 9.1 10*3/uL — ABNORMAL HIGH (ref 1.4–6.5)
Neutrophil %: 91 %
Platelet: 128 10*3/uL — ABNORMAL LOW (ref 150–440)
RBC: 3.37 10*6/uL — ABNORMAL LOW (ref 3.80–5.20)
RDW: 14.3 % (ref 11.5–14.5)
WBC: 10 10*3/uL (ref 3.6–11.0)

## 2014-03-13 LAB — COMPREHENSIVE METABOLIC PANEL
ALK PHOS: 79 U/L
AST: 41 U/L — AB (ref 15–37)
Albumin: 3.3 g/dL — ABNORMAL LOW (ref 3.4–5.0)
Anion Gap: 10 (ref 7–16)
BILIRUBIN TOTAL: 0.4 mg/dL (ref 0.2–1.0)
BUN: 63 mg/dL — AB (ref 7–18)
CHLORIDE: 113 mmol/L — AB (ref 98–107)
CO2: 20 mmol/L — AB (ref 21–32)
CREATININE: 3.04 mg/dL — AB (ref 0.60–1.30)
Calcium, Total: 9.7 mg/dL (ref 8.5–10.1)
EGFR (African American): 16 — ABNORMAL LOW
GFR CALC NON AF AMER: 13 — AB
GLUCOSE: 119 mg/dL — AB (ref 65–99)
Osmolality: 304 (ref 275–301)
Potassium: 4.7 mmol/L (ref 3.5–5.1)
SGPT (ALT): 38 U/L (ref 12–78)
SODIUM: 143 mmol/L (ref 136–145)
Total Protein: 7.7 g/dL (ref 6.4–8.2)

## 2014-03-13 LAB — PROTIME-INR
INR: 1.1
Prothrombin Time: 14.5 secs (ref 11.5–14.7)

## 2014-03-13 LAB — APTT: Activated PTT: 47 secs — ABNORMAL HIGH (ref 23.6–35.9)

## 2014-03-13 LAB — TROPONIN I: Troponin-I: <0.02 ng/mL

## 2014-03-13 LAB — PRO B NATRIURETIC PEPTIDE: B-TYPE NATIURETIC PEPTID: 1630 pg/mL — AB (ref 0–450)

## 2014-03-14 DIAGNOSIS — I059 Rheumatic mitral valve disease, unspecified: Secondary | ICD-10-CM

## 2014-03-14 DIAGNOSIS — Z950 Presence of cardiac pacemaker: Secondary | ICD-10-CM

## 2014-03-14 DIAGNOSIS — I5022 Chronic systolic (congestive) heart failure: Secondary | ICD-10-CM

## 2014-03-14 LAB — CBC WITH DIFFERENTIAL/PLATELET
BASOS ABS: 0 10*3/uL (ref 0.0–0.1)
BASOS PCT: 0.1 %
Eosinophil #: 0 10*3/uL (ref 0.0–0.7)
Eosinophil %: 0 %
HCT: 28.5 % — AB (ref 35.0–47.0)
HGB: 9.5 g/dL — ABNORMAL LOW (ref 12.0–16.0)
LYMPHS ABS: 0.3 10*3/uL — AB (ref 1.0–3.6)
Lymphocyte %: 2.3 %
MCH: 34 pg (ref 26.0–34.0)
MCHC: 33.5 g/dL (ref 32.0–36.0)
MCV: 102 fL — ABNORMAL HIGH (ref 80–100)
MONOS PCT: 0.8 %
Monocyte #: 0.1 x10 3/mm — ABNORMAL LOW (ref 0.2–0.9)
NEUTROS ABS: 13.4 10*3/uL — AB (ref 1.4–6.5)
Neutrophil %: 96.8 %
Platelet: 130 10*3/uL — ABNORMAL LOW (ref 150–440)
RBC: 2.8 10*6/uL — ABNORMAL LOW (ref 3.80–5.20)
RDW: 14.5 % (ref 11.5–14.5)
WBC: 13.8 10*3/uL — ABNORMAL HIGH (ref 3.6–11.0)

## 2014-03-14 LAB — COMPREHENSIVE METABOLIC PANEL
Albumin: 2.7 g/dL — ABNORMAL LOW (ref 3.4–5.0)
Alkaline Phosphatase: 68 U/L
Anion Gap: 9 (ref 7–16)
BUN: 56 mg/dL — ABNORMAL HIGH (ref 7–18)
Bilirubin,Total: 0.3 mg/dL (ref 0.2–1.0)
CALCIUM: 8 mg/dL — AB (ref 8.5–10.1)
CHLORIDE: 115 mmol/L — AB (ref 98–107)
Co2: 19 mmol/L — ABNORMAL LOW (ref 21–32)
Creatinine: 3 mg/dL — ABNORMAL HIGH (ref 0.60–1.30)
EGFR (African American): 16 — ABNORMAL LOW
EGFR (Non-African Amer.): 14 — ABNORMAL LOW
Glucose: 180 mg/dL — ABNORMAL HIGH (ref 65–99)
OSMOLALITY: 305 (ref 275–301)
POTASSIUM: 4.6 mmol/L (ref 3.5–5.1)
SGOT(AST): 33 U/L (ref 15–37)
SGPT (ALT): 29 U/L (ref 12–78)
Sodium: 143 mmol/L (ref 136–145)
Total Protein: 6.4 g/dL (ref 6.4–8.2)

## 2014-03-14 LAB — T4, FREE: FREE THYROXINE: 0.19 ng/dL — AB (ref 0.76–1.46)

## 2014-03-14 LAB — MAGNESIUM: Magnesium: 1.6 mg/dL — ABNORMAL LOW

## 2014-03-14 LAB — TSH: Thyroid Stimulating Horm: 92.6 u[IU]/mL — ABNORMAL HIGH

## 2014-03-15 DIAGNOSIS — E039 Hypothyroidism, unspecified: Secondary | ICD-10-CM

## 2014-03-15 LAB — CBC WITH DIFFERENTIAL/PLATELET
BASOS PCT: 0.1 %
Basophil #: 0 10*3/uL (ref 0.0–0.1)
EOS ABS: 0 10*3/uL (ref 0.0–0.7)
Eosinophil %: 0 %
HCT: 25.2 % — AB (ref 35.0–47.0)
HGB: 8.2 g/dL — ABNORMAL LOW (ref 12.0–16.0)
Lymphocyte #: 0.6 10*3/uL — ABNORMAL LOW (ref 1.0–3.6)
Lymphocyte %: 5.8 %
MCH: 33.3 pg (ref 26.0–34.0)
MCHC: 32.6 g/dL (ref 32.0–36.0)
MCV: 102 fL — ABNORMAL HIGH (ref 80–100)
Monocyte #: 0.2 x10 3/mm (ref 0.2–0.9)
Monocyte %: 2 %
Neutrophil #: 9.3 10*3/uL — ABNORMAL HIGH (ref 1.4–6.5)
Neutrophil %: 92.1 %
PLATELETS: 97 10*3/uL — AB (ref 150–440)
RBC: 2.46 10*6/uL — AB (ref 3.80–5.20)
RDW: 14.7 % — ABNORMAL HIGH (ref 11.5–14.5)
WBC: 10.2 10*3/uL (ref 3.6–11.0)

## 2014-03-15 LAB — COMPREHENSIVE METABOLIC PANEL
ALK PHOS: 67 U/L
Albumin: 2.6 g/dL — ABNORMAL LOW (ref 3.4–5.0)
Anion Gap: 9 (ref 7–16)
BUN: 58 mg/dL — ABNORMAL HIGH (ref 7–18)
Bilirubin,Total: 0.3 mg/dL (ref 0.2–1.0)
CALCIUM: 7.9 mg/dL — AB (ref 8.5–10.1)
CREATININE: 3.82 mg/dL — AB (ref 0.60–1.30)
Chloride: 118 mmol/L — ABNORMAL HIGH (ref 98–107)
Co2: 18 mmol/L — ABNORMAL LOW (ref 21–32)
EGFR (African American): 12 — ABNORMAL LOW
GFR CALC NON AF AMER: 10 — AB
Glucose: 66 mg/dL (ref 65–99)
OSMOLALITY: 303 (ref 275–301)
Potassium: 4.6 mmol/L (ref 3.5–5.1)
SGOT(AST): 54 U/L — ABNORMAL HIGH (ref 15–37)
SGPT (ALT): 29 U/L (ref 12–78)
SODIUM: 145 mmol/L (ref 136–145)
Total Protein: 6.3 g/dL — ABNORMAL LOW (ref 6.4–8.2)

## 2014-03-15 LAB — URINE CULTURE

## 2014-03-16 LAB — BASIC METABOLIC PANEL
Anion Gap: 10 (ref 7–16)
BUN: 59 mg/dL — AB (ref 7–18)
CO2: 17 mmol/L — AB (ref 21–32)
Calcium, Total: 8.1 mg/dL — ABNORMAL LOW (ref 8.5–10.1)
Chloride: 118 mmol/L — ABNORMAL HIGH (ref 98–107)
Creatinine: 3.77 mg/dL — ABNORMAL HIGH (ref 0.60–1.30)
EGFR (African American): 12 — ABNORMAL LOW
GFR CALC NON AF AMER: 10 — AB
Glucose: 198 mg/dL — ABNORMAL HIGH (ref 65–99)
Osmolality: 311 (ref 275–301)
Potassium: 4.2 mmol/L (ref 3.5–5.1)
SODIUM: 145 mmol/L (ref 136–145)

## 2014-03-17 ENCOUNTER — Ambulatory Visit: Payer: Self-pay | Admitting: Internal Medicine

## 2014-03-17 DIAGNOSIS — E039 Hypothyroidism, unspecified: Secondary | ICD-10-CM

## 2014-03-17 DIAGNOSIS — Z950 Presence of cardiac pacemaker: Secondary | ICD-10-CM

## 2014-03-17 DIAGNOSIS — I5022 Chronic systolic (congestive) heart failure: Secondary | ICD-10-CM

## 2014-03-17 LAB — BASIC METABOLIC PANEL
Anion Gap: 9 (ref 7–16)
BUN: 57 mg/dL — AB (ref 7–18)
CALCIUM: 8.2 mg/dL — AB (ref 8.5–10.1)
CHLORIDE: 120 mmol/L — AB (ref 98–107)
CO2: 19 mmol/L — AB (ref 21–32)
Creatinine: 3.36 mg/dL — ABNORMAL HIGH (ref 0.60–1.30)
EGFR (Non-African Amer.): 12 — ABNORMAL LOW
GFR CALC AF AMER: 14 — AB
GLUCOSE: 143 mg/dL — AB (ref 65–99)
OSMOLALITY: 313 (ref 275–301)
POTASSIUM: 3.7 mmol/L (ref 3.5–5.1)
Sodium: 148 mmol/L — ABNORMAL HIGH (ref 136–145)

## 2014-03-18 LAB — BASIC METABOLIC PANEL
Anion Gap: 3 — ABNORMAL LOW (ref 7–16)
BUN: 56 mg/dL — AB (ref 7–18)
CREATININE: 2.87 mg/dL — AB (ref 0.60–1.30)
Calcium, Total: 8 mg/dL — ABNORMAL LOW (ref 8.5–10.1)
Chloride: 117 mmol/L — ABNORMAL HIGH (ref 98–107)
Co2: 26 mmol/L (ref 21–32)
EGFR (Non-African Amer.): 14 — ABNORMAL LOW
GFR CALC AF AMER: 17 — AB
Glucose: 191 mg/dL — ABNORMAL HIGH (ref 65–99)
Osmolality: 311 (ref 275–301)
POTASSIUM: 3.5 mmol/L (ref 3.5–5.1)
Sodium: 146 mmol/L — ABNORMAL HIGH (ref 136–145)

## 2014-03-18 LAB — CULTURE, BLOOD (SINGLE)

## 2014-03-19 ENCOUNTER — Ambulatory Visit: Payer: Self-pay | Admitting: Internal Medicine

## 2014-03-19 LAB — BASIC METABOLIC PANEL
ANION GAP: 7 (ref 7–16)
BUN: 46 mg/dL — ABNORMAL HIGH (ref 7–18)
CHLORIDE: 112 mmol/L — AB (ref 98–107)
Calcium, Total: 8 mg/dL — ABNORMAL LOW (ref 8.5–10.1)
Co2: 26 mmol/L (ref 21–32)
Creatinine: 2.47 mg/dL — ABNORMAL HIGH (ref 0.60–1.30)
EGFR (African American): 20 — ABNORMAL LOW
GFR CALC NON AF AMER: 17 — AB
Glucose: 212 mg/dL — ABNORMAL HIGH (ref 65–99)
OSMOLALITY: 307 (ref 275–301)
POTASSIUM: 3.5 mmol/L (ref 3.5–5.1)
Sodium: 145 mmol/L (ref 136–145)

## 2014-03-19 LAB — PLATELET COUNT: PLATELETS: 75 10*3/uL — AB (ref 150–440)

## 2014-03-20 LAB — BASIC METABOLIC PANEL
Anion Gap: 10 (ref 7–16)
BUN: 37 mg/dL — ABNORMAL HIGH (ref 7–18)
CO2: 26 mmol/L (ref 21–32)
CREATININE: 2.22 mg/dL — AB (ref 0.60–1.30)
Calcium, Total: 8.2 mg/dL — ABNORMAL LOW (ref 8.5–10.1)
Chloride: 108 mmol/L — ABNORMAL HIGH (ref 98–107)
EGFR (Non-African Amer.): 20 — ABNORMAL LOW
GFR CALC AF AMER: 23 — AB
Glucose: 203 mg/dL — ABNORMAL HIGH (ref 65–99)
Osmolality: 301 (ref 275–301)
POTASSIUM: 3.2 mmol/L — AB (ref 3.5–5.1)
Sodium: 144 mmol/L (ref 136–145)

## 2014-03-20 LAB — WBC: WBC: 6.4 10*3/uL (ref 3.6–11.0)

## 2014-04-02 ENCOUNTER — Telehealth: Payer: Self-pay

## 2014-04-02 NOTE — Telephone Encounter (Signed)
[facility] per Ileene Hutchinsonbituary

## 2014-04-19 ENCOUNTER — Ambulatory Visit: Payer: Self-pay | Admitting: Internal Medicine

## 2014-04-19 DEATH — deceased

## 2015-01-10 NOTE — Discharge Summary (Signed)
PATIENT NAME:  [name], [name] MR#:  [id number] DATE OF BIRTH:  [birth year]  DATE OF ADMISSION:  03/13/2014 DATE OF DISCHARGE:  03/20/2014  DISCHARGE DIAGNOSES: Cardiomyopathy, encephalopathy.  CODE STATUS: DNR.   DISCHARGE MEDICATIONS:  1. Nystatin topical powder 3 times a day.  2. Ativan 0.5 mg 2 tablets sublingual every 2 to 4 hours as needed for agitation. 3. Morphine 20 mg/mL concentrated morphine. Take 0.25 mL every 1-2 hours p.r.n. 4. Zofran ODT 4 mg every 4-6 hours for nausea and vomiting.   HOSPICE DISCHARGE DIAGNOSES: 1. Cardiomyopathy. 2. Encephalopathy. 3. Coronary artery disease. 4. Valvular heart disease. 5. CKD stage III.  CONSULTATIONS: Palliative consult with Dr. Harvie Junior, cardiology consult with Dr. Kirke Corin, and hospice consult with Ms.,Genie  HOSPITAL COURSE: An 81 year old female patient brought in because of unresponsiveness. Look at the history and physical for full details. Past medical history is significant for hypertension, hyperlipidemia, diabetes. History of chronic systolic heart failure and a pacemaker. The patient brought in because of decreased energy, decreased mental status, very weak and patient was found to have hypotension with blood pressure of 50s/40s, so she was admitted for septic shock and also possibly pneumonia and UTI. The patient received IV fluids, IV pressors and the patient's chest x-ray showed pulmonary edema and bibasilar atelectasis. The patient's pressors were off  and she was continued on fluids alone and the patient's blood cultures were negative. However, urine cultures showed E. coli, more than 100,000 colonies sensitive to all the antibiotics. The patient was moved out of ICU because she did not need any pressors. She was maintained on fluids, so we moved from ICU to regular floor. Continued on fluids and antibiotics. The patient's mental status remains still with altered mental status. We thought initially she had metabolic encephalopathy  secondary to UTI but later on found that she has dementia, which the patient was unable to talk, unable to communicate. The patient seen by speech therapy. They recommended n.p.o. The patient did not have any nutrition. The patient continued on IV fluids. Despite maximal efforts, the patient continued to be unresponsive and patient's family decided to transfer her to hospice home so palliative care saw the patient and discharged her to hospice home. 1. Acute renal failure and chronic kidney disease stage III. The patient's renal function continued to get worse and the patient seen by nephrology, Dr. Mosetta Pigeon, and he thought she had acute tubular necrosis secondary to sepsis on top of chronic kidney disease. The patient's initial kidney function showed BUN of 65, creatinine 3, and patient had initial improvement in creatinine after fluid resuscitation, but it increased to 3 and gradually went up to 3.82, so Dr. Thedore Mins started her on D5 IV fluids along with bicarb. The patient's bicarb was low at 18 so she did receive bicarbonate drip as well. 2. Bradycardia. The patient did have  a misreading of the EKG and also telemetry reading and heart rate was in like 50s as per the monitor but according to Dr. Kirke Corin, who saw the patient the following day, that is on 26th of June, he said patient rhythm strips were reviewed and the patient bradycardia was reported as not accurate and patient did not have any bradycardia or pacemaker malfunctioning. Anyway, patient needs a pacemaker again in 1 to 2 months because she was running out of battery. Right now she is in hospice home and Dr. Kirke Corin was the patient for a possible pacemaker malfunctioning but her pacemaker was interrogated and it was working  normally and the patient's rhythm strips were reviewed by Dr. Kirke CorinArida and it did not show any evidence of bradycardia. It was a misread by telemetry machine and that was the problem. The patient found to have sepsis due to UTI,  severe hypothyroidism, chronic systolic heart failure. The patient has severe hypothyroidism, required IV thyroid because her TSH was 92 and we supplement her with IV Synthroid here.  CODE STATUS: DNR.  CONDITION: Poor at the time of discharge to hospice home.  DISCHARGE VITALS SIGNS: Heart rate 83, blood pressure 117/75. Temperature 97.3. Saturation 96% on room air.    ____________________________ Katha HammingSnehalatha Frieda Arnall, MD sk:lt/am D: 03/21/2014 12:37:04 ET T: 03/21/2014 23:54:15 ET JOB#: [id number]  cc: Katha HammingSnehalatha Geanine Vandekamp, MD, <Dictator> Katha HammingSNEHALATHA Jashay Roddy MD ELECTRONICALLY SIGNED 04/03/2014 19:52

## 2015-01-10 NOTE — Consult Note (Signed)
PATIENT NAME:  [name], [name] MR#:  [id number] DATE OF BIRTH:  Nov 22, 1926  DATE OF CONSULTATION:  03/14/2014  CONSULTING PHYSICIAN:  Muhammad A. Kirke CorinArida, MD  REQUESTING PHYSICIAN: Dr. Elisabeth PigeonVachhani.  PRIMARY CARDIOLOGIST: Dr. Mariah MillingGollan.   REASON FOR CONSULTATION: Suspected pacemaker malfunction and bradycardia.   HISTORY OF PRESENT ILLNESS: This is an 81 year old female with known history of chronic systolic heart failure, ventricular tachycardia status post biventricular ICD placement, hypertension, hyperlipidemia, diabetes, chronic anemia, and hypothyroidism. She also has chronic kidney disease with a baseline creatinine of 1.9. She was brought to the Emergency Room with unresponsiveness. I could not obtain any history from the patient. She was very hypotensive on presentation and was placed on pressors. Labs showed acute on chronic renal failure with suspected sepsis. She was noted on the heart rate monitor to have heart rate readings of 35 beats per minute. However, looking closely it appears that the machine was not picking the QRS signal and the actual heart rate was 70 beats per minute. The patient is not able to provide history at the present time. She was also found to be severely hypothyroid with elevated TSH and low T3-T4. The patient has not followed up with us in our in the office in more than 1 year and it appears that she missed ointments for pacemaker interrogation.   PAST MEDICAL HISTORY:  1. Chronic systolic heart failure with ejection fraction of 30% to 40%, due to nonischemic cardiomyopathy.  2. Hypertension.  3. Hyperlipidemia.  4. Diabetes mellitus.  5. Chronic kidney disease.  6. Hypothyroidism.  7. Chronic venous insufficiency.  8. Arthritis.   ALLERGIES: PENICILLIN.   REVIEW OF SYSTEMS: Unable to obtain at this time due to confusion.   SOCIAL HISTORY: Negative for smoking or alcohol use.   FAMILY HISTORY: Family history is remarkable for dementia.   PHYSICAL EXAMINATION:   GENERAL: The patient is currently not responsive.  VITAL SIGNS: Temperature 97.8, pulse is 79, respiratory rate is 16, blood pressure is 127/56, and oxygen saturation is 93% on 4 liters nasal cannula.  HEENT: Normocephalic, atraumatic.  NECK: No JVD or carotid bruits.  RESPIRATORY: She has normal respiratory effort with no use of accessory muscles. Auscultation reveals normal breath sounds.  CARDIOVASCULAR: Normal PMI. Normal S1 and S2 with no gallops or murmurs.  ABDOMEN: Benign, nontender, and nondistended.  EXTREMITIES: No clubbing, cyanosis, or edema.   LABORATORY AND DIAGNOSTIC DATA: ECG showed sinus rhythm with ventricular paced rhythm. Labs showed a creatinine of 3 with a BUN of 56. BNP was 1600. White cell count was 13,000. TSH was 92 with a thyroxine of 0.19. Urine culture was positive for gram-negative rod.   IMPRESSION:  1. Sepsis likely due to urinary tract infection.  2. Severe hypothyroidism.  3. Chronic systolic heart failure.  4. Status post biventricular pacemaker ICD placement with no evidence of malfunctioning.   RECOMMENDATIONS: The patient's pacemaker was interrogated and it appears to be functioning normally. However, the battery is approaching end of life and if the family decides for aggressive measures, then the pacemaker will need to be electively replaced within the next month. I reviewed rhythm strips and there is no evidence of bradycardia. It was actually a misread by the telemetry machine due to not appreciating the QRS complex. The lower rate of the pacemaker is set at 50 beats per minute. It appears that the patient has missed appointments with us for device interrogation and it also seems that she has not been taking some of  her medications as evidenced by significant hypothyroidism. Goals of treatment should be clarified with the patient and family.    ____________________________ Chelsea Aus Kirke Corin, MD maa:lt D: 03/14/2014 18:41:21 ET T: 03/14/2014  22:22:46 ET JOB#: [id number]  cc: Muhammad A. Kirke Corin, MD, <Dictator> Iran Ouch MD ELECTRONICALLY SIGNED 03/21/2014 10:24

## 2015-01-10 NOTE — Consult Note (Signed)
PATIENT NAME:  [name], [name] MR#:  [id number] DATE OF BIRTH:  1927-02-21  NEPHROLOGY CONSULTATION   DATE OF CONSULTATION:  03/15/2014  REFERRING PHYSICIAN: Felipa Furnace, MD CONSULTING PHYSICIAN:  Antwuan Eckley Lizabeth Leyden, MD  REASON FOR CONSULTATION: Acute renal failure in the setting of chronic kidney disease stage III.   HISTORY OF PRESENT ILLNESS: The patient is an 81 year old Caucasian female with past medical history of hypertension, hyperlipidemia, diabetes mellitus, peripheral neuropathy, anemia of chronic kidney disease, chronic thrombocytopenia, hypothyroidism, ischemic cardiomyopathy with ejection fraction 30% to 35%, gout, permanent pacemaker placement, chronic kidney disease stage III, secondary hyperparathyroidism, hearing loss and left bundle branch block, who is now brought to [facility] for unresponsiveness. The patient is unable to offer significant history at this point in time. Therefore, history is obtained through discussion with nursing as well as reviewing records. The patient was admitted at 3:10 p.m. yesterday. She was admitted for unresponsiveness. Her functional status at baseline is quite poor, per dictated history and physical. She began having some shortness of breath. She also had increasing lethargy. Therefore, EMS was called. They found that the patient had food products still in her mouth. When she was brought here, she was found to be significantly hypotensive with systolic blood pressure ranging from the 40s to 50s. She was provided with IV fluid hydration as well as pressor therapy, and later in the day, her MAP did come above 60. At present, her blood pressure is 106/70. She is also being evaluated by cardiology. Upon presentation, her creatinine was 3.04. It has now risen to 3.82. There is also evidence of metabolic acidosis, as serum bicarbonate is low at 18. It appears that urine culture is growing E. coli. This is the likely source of  sepsis at present.   PAST MEDICAL HISTORY:  1. Hypertension.  2. Hyperlipidemia.  3. Diabetes mellitus.  4. Peripheral neuropathy.  5. Chronic kidney disease stage III. 6. Anemia of chronic kidney disease.  7. Chronic thrombocytopenia.  8. Hypothyroidism.  9. History of pancreatitis.  10. Ischemic cardiomyopathy with ejection fraction of 35%.  11. Gout.  12. Permanent pacemaker placement.  13. Secondary hyperparathyroidism.  14. Chronic venous insufficiency.  15. Hearing loss.  16. History of left bundle branch block.   ALLERGIES: PENICILLIN.   CURRENT INPATIENT MEDICATIONS: Include:  1. D5 normal saline at 40 mL/hour.  2. Phenylephrine drip.  3. Norepinephrine drip.  4. Solu-Cortef 50 mg IV q.6 hours.  5. Heparin 5000 units subcutaneous q.8 hours.  6. Sliding scale insulin.  7. Meropenem 500 mg IV q.24 hours.  8. Levofloxacin 500 mg IV q.48 hours.  9. Levothyroxine 75 mcg IV daily.  10. Methylprednisolone 40 mg IV q.12 hours.   SOCIAL HISTORY: Unable to obtain directly from the patient. Per dictated history and physical, she lives with her daughter and granddaughter at home. No reported use of tobacco or alcohol.   FAMILY HISTORY: Unable to obtain directly from the patient; however, it appears that her mother had history of dementia.   REVIEW OF SYSTEMS: Unobtainable from the patient given altered mental status.   PHYSICAL EXAMINATION:  VITAL SIGNS: Temperature 99, pulse 75, respirations 16, blood pressure 111/62, pulse oximetry 96%.  GENERAL: Reveals a debilitated-appearing female currently in no acute distress.  HEENT: Normocephalic, atraumatic. Extraocular movements were present. The patient was quite hard of hearing, and hearing was difficult to assess. Oral mucosa was noted to be quite dry.  NECK: Supple, without JVD or lymphadenopathy.  LUNGS: Clear to auscultation bilaterally with normal respiratory effort.  CARDIOVASCULAR: S1 and S2, some periods of irregularity  noted.  ABDOMEN: Soft, nontender, nondistended. Bowel sounds positive. No rebound or guarding. No gross organomegaly appreciated.  EXTREMITIES: No clubbing or cyanosis noted. Trace bilateral lower extremity edema noted.  NEUROLOGIC: The patient is currently awake. She is not consistently following commands, however.  GENITOURINARY: Foley catheter noted to be in place. Urine does appear to be quite cloudy in the tubing.  MUSCULOSKELETAL: No joint redness, swelling or tenderness appreciated.  SKIN: Warm and dry. No rashes noted.  PSYCHIATRIC: Unable to fully assess at this time.   LABORATORY DATA: Sodium 145, potassium 4.6, chloride 118, CO2 18, BUN 50, creatinine 3.82, glucose 66, total protein 6.3, albumin 2.7, total bilirubin 0.3, alkaline phosphatase 67, AST 54, ALT 29. TSH 92.6, free thyroxine 0.19. CBC shows WBC 10.2, hemoglobin 8.2, hematocrit 25, platelets 97,000. Urine culture shows greater than 100,000 colony-forming units of E. coli. CT chest, abdomen and pelvis demonstrates dense bibasilar pulmonary infiltrates consistent with pneumonia, aspiration could present in this fashion. There was cardiomegaly present as well. There were severe degenerative changes in the lumbar spine with grade 1 to 2 L4-L5 spondylolisthesis. Kidneys were negative for hydronephrosis.   IMPRESSION: This is an 81 year old Caucasian female with past medical history of hypertension, hyperlipidemia, diabetes mellitus, peripheral neuropathy, chronic kidney disease stage III, anemia of chronic kidney disease, chronic thrombocytopenia, hypothyroidism, history of pancreatitis, ischemic cardiomyopathy, gout, permanent pacemaker placement, secondary hyperparathyroidism, chronic venous insufficiency, degenerative joint disease of the lumbar spine, significant hearing loss and left bundle branch block, who was [facility] with unresponsiveness.    PROBLEM LIST:  1. Acute renal failure, suspect  acute tubular necrosis.  2. Chronic kidney disease stage III at baseline. Baseline creatinine 1.6 from December 2012.  3. Septic shock with urinary tract infection as well as bibasilar pneumonia.  4. Metabolic acidosis.   PLAN: The patient presents with a very severe illness at this point in time. She has multiple sources of infection, including pneumonia as well as urinary tract infection. Her acute renal failure is most likely as a result of septic shock, and therefore she has acute tubular necrosis. Creatinine has risen to 3.82 at present. Urine output thus far has only been 250 mL. At present, there is no immediate indication for dialysis; however, she may require acute dialysis to treat the acute renal failure if oliguria persists. However, her overall functional status appears to be quite poor, and the patient would not make a good long-term dialysis candidate. For now, we would recommend continued supportive care with IV fluid hydration, pressor therapy and antibiotic therapy to treat the underlying septic shock. The patient is currently limited code status. We would avoid any further nephrotoxins such as NSAIDs or IV contrast. We would also continue to monitor her anemia.   I would like to thank Dr. Mordecai MaesSanchez for this kind referral. Further plans as the patient progresses.   ____________________________ Lennox PippinsMunsoor N. Jamaiya Tunnell, MD mnl:lb D: 03/15/2014 12:06:11 ET T: 03/15/2014 12:36:10 ET JOB#: [id number]  cc: Lennox PippinsMunsoor N. Corrine Tillis, MD, <Dictator> Lennox PippinsMUNSOOR N Landrie Beale MD ELECTRONICALLY SIGNED 03/15/2014 16:18

## 2015-01-10 NOTE — H&P (Signed)
PATIENT NAME:  [name], [name] MR#:  [id number] DATE OF BIRTH:  [birth year]  DATE OF ADMISSION:  03/13/2014  REASON FOR ADMISSION:  Unresponsiveness.   PRIMARY CARE PHYSICIAN:  Dr. Terance Hart.   REFERRING PHYSICIAN:  Dr. Lenard Lance.   HISTORY OF PRESENT ILLNESS: This is an 81 year old female with multiple medical problems including hypertension, hyperlipidemia, diabetes, chronic anemia with chronic thrombocytopenia, ischemic cardiomyopathy with an ejection fraction of 35% to 40% and a permanent pacemaker due to bradycardia.   Has chronic kidney disease stage III with a baseline of around 1.9 and severe degenerative joint disease.   The patient comes today with a history of being her normal self yesterday, getting around as she always does minimally, but just her regular baseline, and overnight, at around 7:30 p.m., started having some shortness of breath. Her shortness of breath lasted through the night and happened just after dinner. In the morning, the patient was having some decreased energy, decreased mentation and was really weak, could not hold herself up, could not get out of the bed, and through the day it started to get worse as her mental status continued to decline. Overnight, she had some deep cough and sounded very congested, and since the patient became unresponsive,  the family called EMS. EMS arrived and found that she had a significant amount of food on the top of her mouth.   The patient actually had breakfast and apparently lunch, despite her mental status was already compromised.   The patient was evaluated in the Emergency Department by Dr. Lenard Lance. Her blood pressure was extremely low, 50s/40s, 40s/30s. She got a central line placed and pressors were started. Levophed was increased significantly without any significant response, and the patient had blood pressures in the 50s/40s, and 20 mcg of Levophed. I started her then on phenylephrine. The phenylephrine has helped  significantly her blood pressure now. Her MAP is above 60. Overall, the patient is admitted for evaluation of this condition. Her lactic acid is normal.   her white blood count is not significantly elevated, for what we are looking for other causes of this, rather than just sepsis. The patient is very unresponsive. She is moving very well her left side, but she is not moving much her right side, for what CT scan of the head, chest, abdomen and pelvis have been ordered, since we do not have a source. Hospitalist service consulted for admission to the critical care unit.   REVIEW OF SYSTEMS:  Unable to obtain as the patient is unresponsive.   PAST MEDICAL HISTORY: 1.  Hypertension.  2.  Hyperlipidemia.  3.  Diabetes.  4.  Diabetic neuropathy.  5.  Chronic disease anemia.  6.  Chronic thrombocytopenia.  7.  Hypothyroidism.  8.  History of pancreatitis in the past.  9.  Coronary artery disease.  10.  Ischemic cardiomyopathy with ejection fraction of 30% to 40%, moderate to severe TR and AR.  11.  Gout.  12.  Permanent pacemaker.  13.  Chronic kidney disease stage III.  14.  Secondary hypothyroidism.  15.  Chronic venous insufficiency.  16.  DJD of the lumbar spine.  17.  Significant hearing loss.  18.  Left bundle branch block.   ALLERGIES: PENICILLIN WITH A RASH.   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   FAMILY HISTORY:  Positive for dementia in her mother. Great-granddaughter had liver disease for unknown cause.   SOCIAL HISTORY:  The patient was not a smoker, not a drinker. No drug use. She lives with her daughter and granddaughter at home. She gets around with the help of a walker.   MEDICATIONS:  Unable to obtain at this moment as the patient's family do not have them.   We will update as soon as they are available.   PHYSICAL EXAMINATION:  VITAL SIGNS:  Blood pressure as low as 40s/30s. Right now, last blood pressure 59/49. Heart rate started at 50, now is 74. Respirations around 17. Oxygen saturation on oxygen has been around 92% on 2 liters. No oxygen saturation measure prior to oxygen being given. The patient looks critically ill, is hypothermic with a temperature of 87 rectally, severely hypotensive with blood pressures systolics in the 60s.  GENERAL:  Mostly nonresponsive. Looked dry.  HEENT:  Her pupils are equal, round, reactive. Her extraocular movements unable to assess, as the patient is unresponsive. She moves and she is able to make some noises and withdraw to pain but does not follow commands. Her fundal exam did not show any significant papilledema or hemorrhages.  Her mucosa is very dry. No oral lesions. No oropharyngeal exudates. Anicteric sclerae. Thick dry phlegm on the back of her throat.  NECK: Supple. No JVD. No thyromegaly. No adenopathy. No carotid bruits. No significant rigidity.  CARDIOVASCULAR:  Regular rate and rhythm. Pacemaker in position. No murmurs, rubs or gallops are appreciated. No displacement of PMI. No tenderness to palpation of anterior chest wall.  LUNGS:  Significant rales at the level of the right more than left base. No wheezing. Positive use of accessory muscles. No dullness to percussion.  ABDOMEN:  Distended and tender to palpation and there is a little bit of guarding, but the patient is very unresponsive and barely moves, although she does react to palpation of the abdomen. Bowel sounds are decreased. No masses.  GENITALIA:  Negative for external lesions. Foley in place.  EXTREMITIES:  No significant edema. No significant cyanosis. Positive cold extremities and pale.  VASCULAR:  Pulses are barely detectable due to the hypotension and very faint. Capillary refill about 5 to 6 seconds.  LYMPHATIC:  Negative for  lymphadenopathy in the neck or supraclavicular area.  MUSCULOSKELETAL:  No joint effusions or swelling.  NEUROLOGIC:  The patient is unresponsive. She withdraws to pain on the left side, but not much on the right side, seems weaker. The patient moves her left side freely occasionally, and spontaneously occasionally, but not on commands.  PSYCHIATRIC:  Exam is unable to be performed as the patient is unresponsive.  SKIN:  Decreased turgor. No rashes. No petechiae.   LABORATORY DATA: Glucose 119, BUN 63, creatinine sodium 143, potassium 4.7, CO2 is 20. GFR is 13. Total protein 7.7, albumin 2.3. AST is slightly elevated at 41. White blood count 10,000. Hemoglobin is 11.1, platelet count 128, which is normal for her as she has thrombocytopenia. INR 1.1. Urinalysis 17 white blood cells, 1 red blood cell, trace leukocyte esterase. PH 7.21, pO2 of 90, lactic acid is only 1. HCO3 is 15.2.   CHEST X-RAY:  Interstitial findings might represent mild pulmonary edema, bibasilar infiltrates versus atelectasis. CT of the head, abdomen, chest and pelvis is pending.   EKG:  Pacemaker spikes on sync.   ASSESSMENT AND PLAN: An 81 year old female with unresponsiveness.  1.  Unresponsive. The  patient has metabolic encephalopathy secondary to sepsis and hypotension, likely perfusion of the brain. We are going to do a CT scan of the head to rule out the possibility of strokes versus bleeding. The patient admitted to the ICU, neuro checks  frequently.  2.  Septic shock. This is likely a manifestation of either pneumonia, which could be aspiration  likely versus urinary tract infection. The patient was given clindamycin for possible aspiration. Right now, we are going to change the antibiotics to meropenem, Levaquin, and blood cultures have been sent, so continue meropenem, continue levofloxacin and vancomycin to cover for community-acquired pneumonia in a patient who is severely ill and complicated. Continue fluid support.  Continue pressor support. Be careful with the fluids, as the patient has significant ischemic cardiomyopathy with systolic ejection fraction of 16%35% to 40%. Pressor support should be maximized with Levophed and phenylephrine as needed.  3.  Acute kidney injury, likely secondary to acute tubular necrosis due to her hypotension. Her creatinine is 3.0 today. IV fluids given gently.  4.  Anemia of chronic disease is stable with a hemoglobin of 11.  5.  Thrombocytopenia, also stable.  6.  Diabetes. Blood sugar seems to be appropriate. We are going to start her on insulin sliding scale.  7.  Hypertension. Hold blood pressure medications.  8.  Congestive heart failure, systolic, nonactive, compensated. Continue to monitor as the patient might start having a little bit of fluid overload now. Hold any medications like beta blockers or Ace inhibitors for now. 9.  Metabolic acidosis. The patient has non-anion gap acidosis. Consider the possibility of intravascular, intra-abdominal infection. CT of the abdomen is ordered. Her lactic acid is normal. 10.  Hypothyroidism. Continue Synthroid IV.  11. Deep vein thrombosis prophylaxis with heparin. Watch for thrombocytopenia.  12.  Gastrointestinal prophylaxis with Pepcid.   Her other medical problems are stable. The patient is guarded prognosis, high risk of cardiovascular collapse and cardiac arrest. The family does not want her to be intubated or to have cardiac massage, so her CODE IS LIMITED. They are okay with pressors, antiarrhythmics and fluid resuscitation, but again, no intubation, no cardiac massage or defibrillation.   I spent about 60 minutes with this critical care time.      ____________________________ Felipa Furnaceoberto Sanchez Gutierrez, MD rsg:dmm D: 03/13/2014 18:33:49 ET T: 03/13/2014 19:08:22 ET JOB#: [id number]  cc: Felipa Furnaceoberto Sanchez Gutierrez, MD, <Dictator> ROBERTO Juanda ChanceSANCHEZ GUTIERRE MD ELECTRONICALLY SIGNED 03/14/2014 15:22

## 2015-01-10 NOTE — Consult Note (Signed)
   Comments   I met with pt's daughter and granddaughter. Granddaughter is pt's HCPOA. I updated them on pt's current condition. They understand that pt has not improved clinically and is likely approaching the end of life. We discussed transfer to the [facility] and they are in agreement with this. Ginny Ward, RN, liason for the [facility] notified and will see pt.  Dx: Cardiomyopathy     Secondary Dx: encephalopathy, CAD, valvular heart disease, CKD III  Electronic Signatures: Tyriq Moragne, Izora Gala (MD)  (Signed 02-Jul-15 14:39)  Authored: Palliative Care   Last Updated: 02-Jul-15 14:39 by Latroya Ng, Izora Gala (MD)

## 2015-01-10 NOTE — Consult Note (Signed)
Brief Consult Note: Diagnosis: sepsis, severe hypothyroidism, chronic systolic heart failure.   Patient was seen by consultant.   Consult note dictated.   Comments: Report of bradycardia and HR of 36 is not accurate. All rhythm strips were reviewed. Actual HR was 70.  NO evidence of bradycardia or pacemaker malfunction. The pacemaker was interrogated and will need elective replacement in 1-2 months.  Electronic Signatures: Lorine BearsArida, Muhammad (MD)  (Signed 26-Jun-15 18:34)  Authored: Brief Consult Note   Last Updated: 26-Jun-15 18:34 by Lorine BearsArida, Muhammad (MD)

## 2015-01-11 NOTE — H&P (Signed)
[name] NAME:  [name], [name] MR#:  [id number] DATE OF BIRTH:  [birth year]  DATE OF ADMISSION:  09/15/2011  REFERRING PHYSICIAN: Dr. Mayford Knife.  PRIMARY CARE PHYSICIAN: Dr. Terance Hart.   PRESENTING COMPLAINT: Weakness, productive cough, shortness of breath, falls.   HISTORY OF PRESENT ILLNESS: [name] is an 81 year old woman with history of hearing loss, hypertension, hyperlipidemia, diabetes, cardiomyopathy with ejection fraction of 35 to 40%, status post pacemaker placement presenting with her daughter and granddaughter. [name] is a poor historian, has difficulty hearing, history is mainly received from her daughter and granddaughter whom she resides with. Apparently for Desiree past one week now, she has had increase in weakness and poor ambulatory effort. [name] had a fall six days ago on Saturday followed by another fall on Sunday. She has had productive cough for Desiree past two days. She has had some shortness of breath on and off for Desiree past one week. Her cough has been productive with yellow sputum. No blood. According to her daughter and granddaughter, they have noticed that she has been having some increased confusion for Desiree past six months now and has been progressively getting worse. For Desiree past one month, it has been accelerating and for Desiree past one week, has been worsening even more. She has been talking to people that have been deceased. She has had increase in sleep. [name] denies any chest pain. She has been dealing with pain in her feet from her gout. She has had an injury to her left middle finger with laceration that required stitching and unknown injury to her right finger with swelling and drainage that has been worsening. She, herself, reports being aware of fevers or chills.   PAST MEDICAL HISTORY:  1. Admitted in March 2012 for acute pancreatitis.  2. Hypertension.  3. Hyperlipidemia.  4. Diabetes and peripheral neuropathy.  5. Hypothyroidism.  6. Chronic anemia and  looks like chronic thrombocytopenia.  7. Gout.  8. Gastroesophageal reflux disease.  9. Cardiomyopathy with ejection fraction of 35 to 40%, status post pacemaker. Echocardiogram from January 2011 showed ejection fraction of 35 to 40%, moderate MR and AR and moderate to severe TR.  10. Esophagogastroduodenoscopy in 2007 with gastritis and hiatal hernia.  11. Left bundle branch block.  12. Chronic kidney disease stage III. Baseline creatinine is around 1.9.  13. Secondary hypothyroidism.  14. Chronic venostasis.  15. Degenerative joint disease and degenerative disk disease with moderate to severe lumbar stenosis.  16. Hearing loss.   PAST SURGICAL HISTORY:  1. Cholecystectomy.  2. Tubal ligation.  3. Cataract surgery.  4. Pacemaker placement and status post pacemaker exchange in March 2012.  5. Multiple right ear surgeries.  6. Resection of carcinoid tumor from rectum.   ALLERGIES: Penicillin.   MEDICATIONS:  1. Tylenol 650 mg as needed.  2. Omeprazole 20 mg daily.  3. Nystatin powder as needed.  4. Meclizine 25 mg every six hours as needed.  5. Lipitor 40 mg daily.  6. Synthroid 100 mcg daily.  7. Lasix 20 mg daily.  8. Glipizide XL 10 mg daily.  9. Detrol LA 4 mg daily.  10. Carvedilol 3.125 mg b.i.d.  11. Caltrate 500 with vitamin D b.i.d.  12. Aspirin 81 mg daily.  13. Centrum Silver daily.  14. Desonate gel 0.05% as needed.  15. Indomethacin 50 mg t.i.d. as needed. [name] has not been taking this medication.   FAMILY HISTORY: Heart disease. Mother with dementia. Great-granddaughter had liver disease.  SOCIAL HISTORY: No tobacco, alcohol or drug use. She lives with her daughter and granddaughter at home.   REVIEW OF SYSTEMS: CONSTITUTIONAL: Denies being aware of fever, but did presents with temperature of 102.1. Her family endorses weakness. EYES: History of cataracts. ENT: Has significant hearing loss, even hearing aids are not helpful. She wears dentures.  RESPIRATORY: Endorses yellow sputum, productive cough. No hemoptysis. Reports intermittent shortness of breath and dyspnea on exertion. CARDIOVASCULAR: No chest pain or orthopnea. Reports her edema has worsened in Desiree lower extremity. No syncope. GASTROINTESTINAL: No nausea, vomiting, diarrhea, hematemesis, or melena. GU: No dysuria or hematuria. ENDOCRINE: No polyuria or polydipsia. HEME: She has easy bruising. SKIN: She has erythema and swelling of her right finger and laceration of her left finger. MUSCULOSKELETAL: Reports dealing with gout pain. NEUROLOGIC: No one-sided weakness or numbness. PSYCH: Denies any suicidal ideation.   PHYSICAL EXAMINATION:  VITAL SIGNS: Temperature 102.1, pulse 89, respiratory rate 24, blood pressure 104/44, saturating at 90% on room air.   GENERAL: Lying in bed in no apparent distress.   HEENT: Normocephalic, atraumatic. Pupils are equal, symmetric. Anicteric. Nares with nasal cannula in place. Moist mucous membrane.   NECK: Soft and supple. No adenopathy or JVP.   HEART: Slightly tachy. No murmurs, rubs, or gallops.   LUNGS: Coarse breath sounds and basilar crackles. No use of accessory muscles or increased respiratory effort.   ABDOMEN: Soft. Positive bowel sounds. No mass appreciated.   EXTREMITIES: 1 to 2+ pitting edema bilaterally. Dorsal pedis pulses intact.   MUSCULOSKELETAL: No tophaceous gouty signs, but on her right third finger there is serosanguineous leakage, fluctuance, tenderness and warmth.   SKIN: [name] has stage I decubitus lesion.   PSYCH: She is alert and answering questions appropriately, but difficult to assess due to her difficult hearing.   LABORATORY, DIAGNOSTIC AND RADIOLOGICAL DATA: Influenza A positive, negative for influenza B. WBC 10.8, hemoglobin 9.9, hematocrit 29, platelets 131, MCV 94, glucose 143, BUN 50, creatinine 2.6, sodium 135, potassium 3.4, chloride 105, carbon dioxide 17, calcium 8.3, total bilirubin 0.4,  alkaline phosphatase 79, ALT 24, AST 52, total protein 7.3, albumin 3, troponin 0.03. EKG with paced rhythm. Chest x-ray with right basilar infiltrate.   ASSESSMENT AND PLAN: [name] is an 81 year old woman with history of diabetes, cardiomyopathy, chronic kidney disease stage III, hypothyroidism, anemia, thrombocytopenia, gout, hypertension, presenting with complaints of confusion, weakness, falls cough, shortness of breath, lower extremity and hand pain.  1. Right basilar pneumonia, influenza A, hypoxia, sepsis. Continue antibiotic treatment and complete Tamiflu regimen. Continue oxygen, SVNs as needed. Send blood culture, sputum culture. [name] received ceftriaxone without any complications with her history of penicillin. 2. Acute on subacute altered mental status, probable underlying dementia plus/minus psychosis with acute worsening in Desiree setting of infection and assessment complicated by hearing loss. [name] talking to people who are deceased. We will get a CT head scan. Will also get psych consult as there is possible underlying psychosis.  3. Right finger cellulitis and abscess of intermittent drainage. History of gout, but does not appear to be gouty flare. She has not been taking her indomethacin. Will hold off on reinitiating for now. General surgery to evaluate for incision and drainage. Antibiotic regimen as above and continue to follow.  4. Acute on chronic renal failure, likely prerenal. Gentle IV fluids for 24 hours. Daily creatinine, input and output and daily weights. Hold her Lasix for now.  5. Hypokalemia. Send magnesium level. Replace as needed.  Holding Lasix as above.  6. Diabetes. Hold glipizide. Start sliding scale insulin.  7. Hypothyroidism. Send TSH. Restart her Levoxyl.  8. Cardiomyopathy with ejection fraction of 35 to 40% with valvular disease. As above, follow closely with fluid repletion.  9. Chronic anemia and thrombocytopenia. Appears to be stable compared to  her previous numbers. Follow up with her primary care physician.  10. Prophylaxis with omeprazole, aspirin and heparin subcutaneous.   TIME SPENT: Approximately 50 minutes were spent on [name] care.   ____________________________ Reuel Derby, MD ap:ap D: 09/15/2011 01:57:52 ET T: 09/15/2011 08:54:24 ET JOB#: [id number]  cc: Pearlean Brownie Sal Spratley, MD, <Dictator> Teena Irani. Terance Hart, MD Reuel Derby MD ELECTRONICALLY SIGNED 10/08/2011 2:21

## 2015-01-11 NOTE — Discharge Summary (Signed)
PATIENT NAME:  [name], [name] MR#:  [id number] DATE OF BIRTH:  [birth year]  DATE OF ADMISSION:  09/15/2011 DATE OF DISCHARGE:  09/21/2011  PRIMARY CARE PHYSICIAN: Dorothey Baseman, MD  INFECTIOUS DISEASE PHYSICIAN: Orson Aloe, MD  DISCHARGE DIAGNOSES:  1. Methicillin-resistant Staphylococcus aureus pneumonia (post-influenza) with no benefit from Tamiflu per Dr. Leavy Cella. She is on Zyvox and will need a total 14 day course through PICC line.  2. Methicillin-resistant Staphylococcus aureus bacteremia on IV Zyvox and will need a total of 14 day course.  No fever or leukocytosis for the last 2 to 3 days. Echocardiogram negative for any vegetation.  3. Acute on chronic kidney disease, stage III. Creatinine close to baseline (1.5). This is likely prerenal.  4. Cardiomyopathy with chronic systolic heart failure, well compensated on Coreg and Lasix. Not a candidate for ACE inhibitor due to acute on chronic renal failure.  5. Right fourth finger swelling/blister which may have been a result of burn on the right ring finger, not infected. Orthopedic evaluation obtained who recommended small soft dressing which should help this to burst and drain. No surgery indicated.  6. Hypomagnesemia, repleted and resolved.  7. Influenza A positive. Not a candidate for Tamiflu, as per Dr. Leavy Cella.   SECONDARY DIAGNOSES:  1. Hypertension.  2. Hyperlipidemia.  3. Diabetes and peripheral neuropathy.  4. Hypothyroidism. 5. Chronic anemia and thrombocytopenia. 6. Gout. 7. Gastroesophageal reflux disease.  8. Cardiomyopathy with ejection fraction of 35 to 40%, status post pacemaker.  9. Left bundle branch block.  10. Chronic kidney disease stage III with baseline creatinine of 1.5 to 1.9.  11. Secondary hypothyroidism.  12. Chronic venostasis.  13. Degenerative joint disease. 14. Hearing loss.   CONSULTANTS: 1. Mordecai Rasmussen, MD - Psychiatry. 2. Myra Rude, MD - Orthopedics. 3. Orson Aloe, MD -  Infectious Disease.  4. Physical Therapy.   PROCEDURES/RADIOLOGY:  Ultrasound-guided PICC line in the right arm by Dr. Wyn Quaker on 09/19/2011.   Chest x-ray on 09/14/2011 showed right lower lobe pneumonia.   CT scan of the head without contrast on 09/15/2011 showed no acute abnormality.   2-D echocardiogram on 09/17/2011 showed normal LV systolic function with an ejection fraction of 55%. Moderate to severe mitral regurgitation. Mild tricuspid regurgitation. No wall vegetation.   MAJOR LABORATORY PANEL: Influenza A was positive.   Blood cultures x2 were negative, on 09/15/2011.   Sputum culture grew heavy growth of Staphylococcus aureus.  One of the blood cultures grew MRSA, in the anaerobic bottle, on 09/15/2011.   Repeat blood cultures on 09/17/2011 were negative x2.   HISTORY AND SHORT HOSPITAL COURSE: The patient is an 81 year old female with the above-mentioned medical problems who was admitted for right lower lobe pneumonia. She was also found to be positive for influenza A. She was hypoxic. She was started on IV antibiotics in the form of Rocephin which was switched over to Zyvox for possible MRSA. After consultation with Dr. Leavy Cella, he recommended a total 14 day course as one of the blood cultures also returned positive for MRSA and sputum culture was growing the same. She had evaluation by psychiatry, Dr. Toni Amend, who did not find any psychiatric issues. He was consulted for possible agitation. The patient had a right fourth finger blister which was thought to be secondary to burn on the finger and an orthopedic consult was obtained as there was a concern for possible infection. Dr. Myra Rude evaluated her and did not find any obvious infection and recommended dressing changes, which was  started while in the hospital. As the patient would need a total of 14 days of IV antibiotics, a PICC line was requested which was performed by Dr. Wyn Quakerew on 12/31/201. The patient has been slowly  improving. She is not requiring any oxygen. She also had significant improvement in her creatinine, she has acute on chronic kidney disease stage III, and her creatinine is back to baseline. She underwent 2-D echocardiogram to rule out any vegetation and it was negative. She was doing much better on 09/21/2011. She does have a bed available at a rehab facility where she will be discharged today in stable condition.   DISCHARGE PHYSICAL EXAMINATION:   VITAL SIGNS: On discharge her temperature is 98.2, heart rate 82 per minute, respiration 20 per minute, blood pressure 134/86 mmHg, and she is saturating 94% on room air on exertion and 94% on room air at rest. She does not qualify for oxygen.   HEART:  S1 and S2 normal. No murmurs, rubs or gallops.   LUNGS: Clear to auscultation bilaterally. No wheezing, rales, rhonchi, or crepitation.   ABDOMEN: Soft and benign.   NEUROLOGIC: Nonfocal examination.   EXTREMITIES: Her right second and third finger has a dressing present. There is no obvious discharge or tenderness.   All other physical examination remained at baseline.   DISCHARGE MEDICATIONS:  1. Lipitor 40 mg p.o. daily.  2. Omeprazole 20 mg p.o. daily.  3. Lasix 20 mg p.o. daily as needed. 4. Pro Air two puffs inhaled twice a day.  5. Detrol LA  4 mg p.o. daily at bedtime.  6. Meclizine 25 mg p.o. daily. 7. Levoxyl 137 mcg p.o. daily. 8. Glipizide XL 10 mg p.o. daily.  9. Caltrate with vitamin D 1 tablet p.o. daily.  10. Coreg 3.125 mg p.o. twice a day. 11. Nystat-RX compounding powder as needed.  12. Tylenol 650 mg p.o. daily as needed.  13. Aspirin 81 mg p.o. daily.  14. Zyvox 600 mg IV twice a day for seven days.   DISCHARGE DIET: Low sodium, 1800 ADA diet.  DISCHARGE ACTIVITY: As tolerated.   DISCHARGE INSTRUCTIONS AND FOLLOW-UP: The patient was instructed to keep a dry bandage on, which has already been applied, to her right finger wound, and keep the bandage clean and dry.  She will need followup with her primary care physician, Dr. Dorothey Basemanavid Bronstein, in 1 to 2 weeks and do routine wound care for PICC line, which is in place, and this can be taken out once antibiotics are finished, after 7 days.   TOTAL TIME DISCHARGING THIS PATIENT: 55 minutes.  ____________________________ Ellamae SiaVipul S. Sherryll BurgerShah, MD vss:slb D: 09/21/2011 10:52:00 ET T: 09/21/2011 11:23:53 ET JOB#: [id number]  cc: Gracy Ehly S. Sherryll BurgerShah, MD, <Dictator> Teena Iraniavid M. Terance HartBronstein, MD Rosalyn GessMichael E. Blocker, MD Audery AmelJohn T. Clapacs, MD Clare Gandyhristopher E. Smith, MD Ellamae SiaVIPUL S [facility] HospitalHAH MD ELECTRONICALLY SIGNED 09/21/2011 22:48

## 2015-01-11 NOTE — Op Note (Signed)
PATIENT NAME:  [name], [name] MR#:  [id number] DATE OF BIRTH:  12/28/1926  DATE OF PROCEDURE:  09/19/2011  PREOPERATIVE DIAGNOSIS: Bacteremia pneumonia with need for long-term antibiotics.  POSTOPERATIVE DIAGNOSIS: Bacteremia pneumonia with need for long-term antibiotics.  PROCEDURES: 1. Ultrasound guidance for vascular access, right basilic vein.  2. Fluoroscopic guidance for placement of catheter.  3. Placement of a peripherally inserted central venous catheter, right arm.   SURGEON: Annice NeedyJason S. Dew, MD   ASSISTANT: Gari Crownhristopher Gibbons, vascular technologist   ESTIMATED BLOOD LOSS: Minimal.  INDICATION FOR PROCEDURE: This is an 81 year old white female with pneumonia and bacteremia. She is going to require two weeks of IV antibiotics and a PICC line has been requested.   DESCRIPTION OF PROCEDURE: The patient was brought to the Vascular Interventional Radiology Suite. The right upper extremity was sterilely prepped and draped and a sterile surgical field was created. The right basilic vein was visualized with ultrasound and found to be patent. It was then accessed under direct ultrasound guidance with a micropuncture needle. An 0.018 wire was then placed. Peel-away sheath was placed over the wire and a peripherally inserted central venous catheter was placed over the wire through the sheath. The sheath and the wire were removed. The catheter tip was placed into the superior vena cava and was secured to the skin at 37 cm with a sterile dressing. The catheter withdrew blood well and flushed easily with heparinized saline.   ____________________________ Annice NeedyJason S. Dew, MD jsd:drc D: 09/19/2011 16:31:54 ET T: 09/21/2011 10:09:37 ET JOB#: [id number] cc: Annice NeedyJason S. Dew, MD, <Dictator> Annice NeedyJASON S DEW MD ELECTRONICALLY SIGNED 10/07/2011 7:57

## 2016-06-22 IMAGING — CT CT HEAD WITHOUT CONTRAST
1 of 2 series · 16 of 30 positions shown, 20 images · non-contrast
Comparison: Head CT 06/05/2013.

CLINICAL DATA: Altered mental status.

EXAM:
CT HEAD WITHOUT CONTRAST
TECHNIQUE: Contiguous axial images were obtained from the base of the skull
through the vertex without intravenous contrast.

[Series 2: head wo · axial · 0.41mm/px · z∈[+181,+307]mm · 16 of 32 slices shown, 20 images]
[im 2/32  brain]
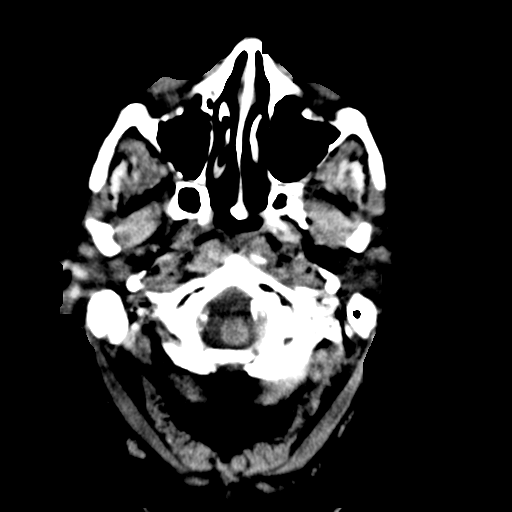
[im 2/32  bone]
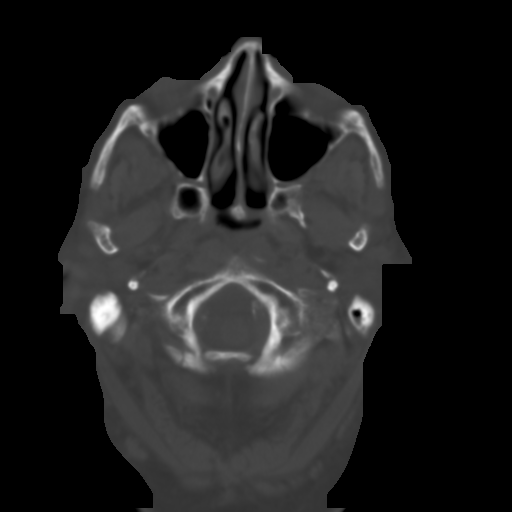
[im 4/32  brain]
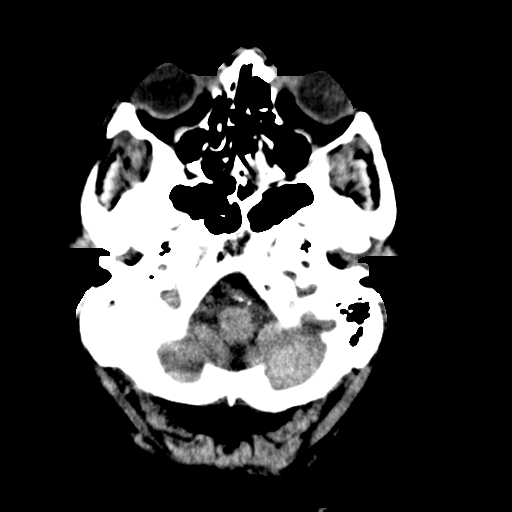
[im 5/32  brain]
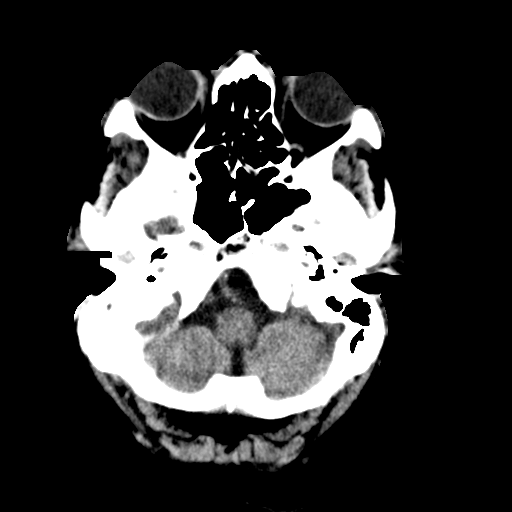
[im 7/32  brain]
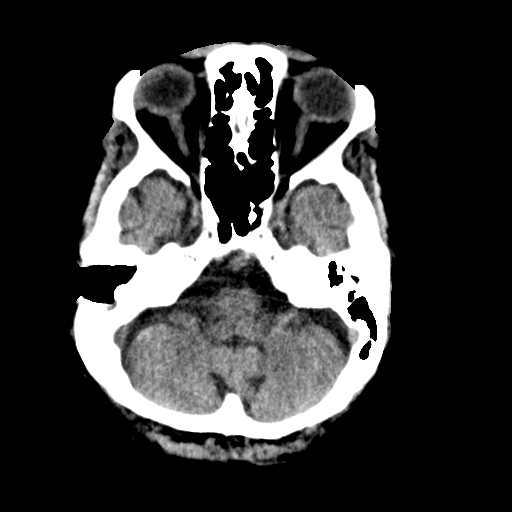
[im 10/32  brain]
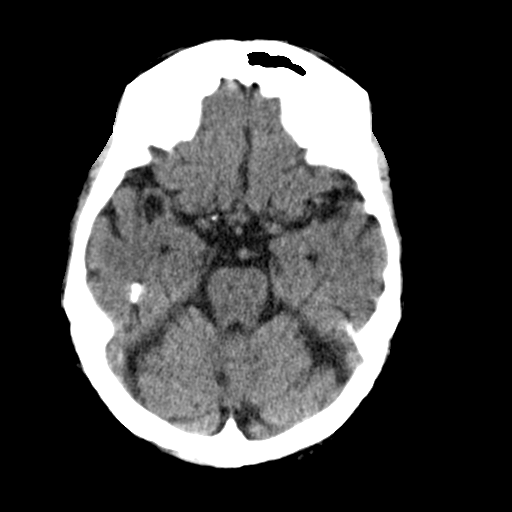
[im 10/32  bone]
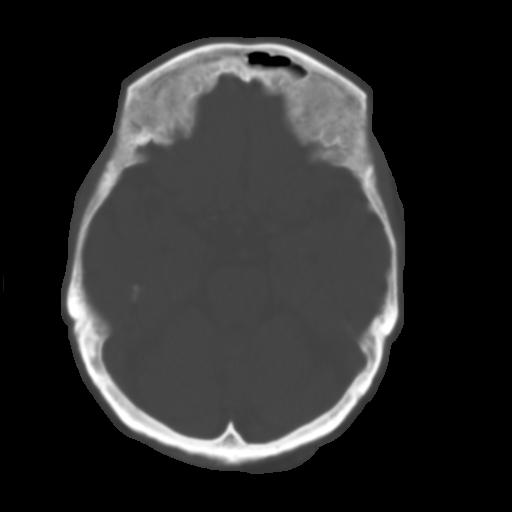
[im 12/32  brain]
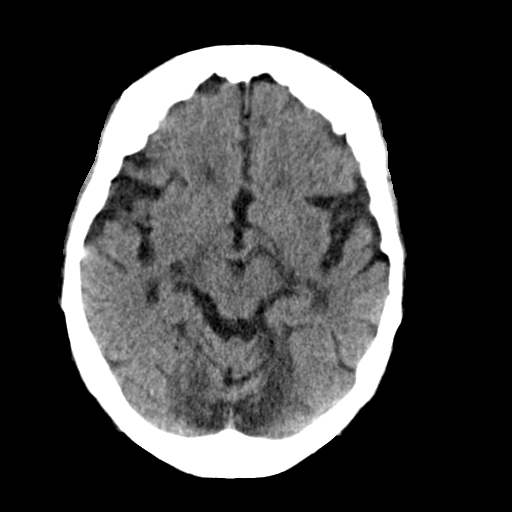
[im 14/32  brain]
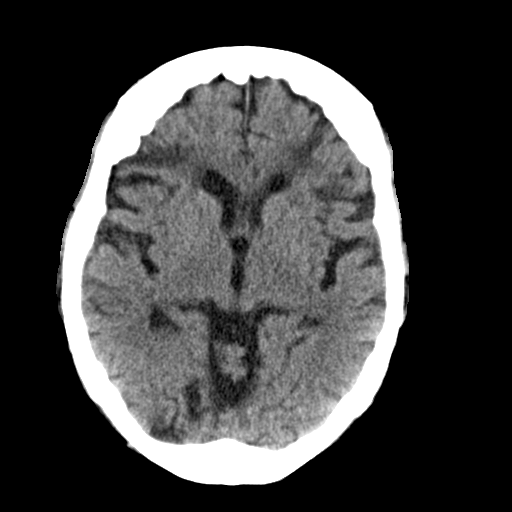
[im 15/32  brain]
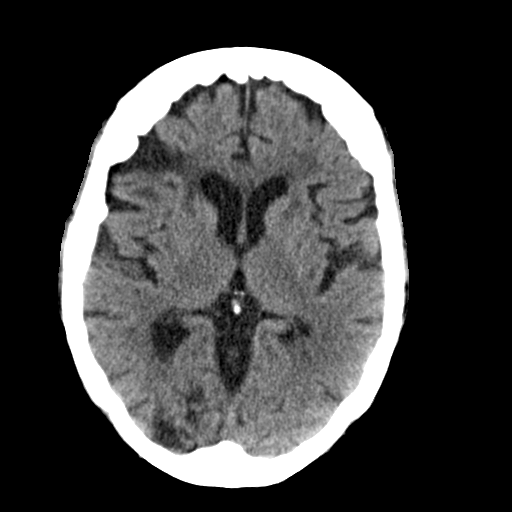
[im 17/32  brain]
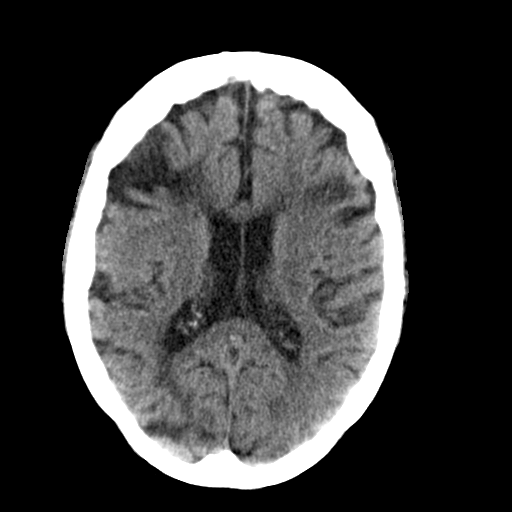
[im 17/32  bone]
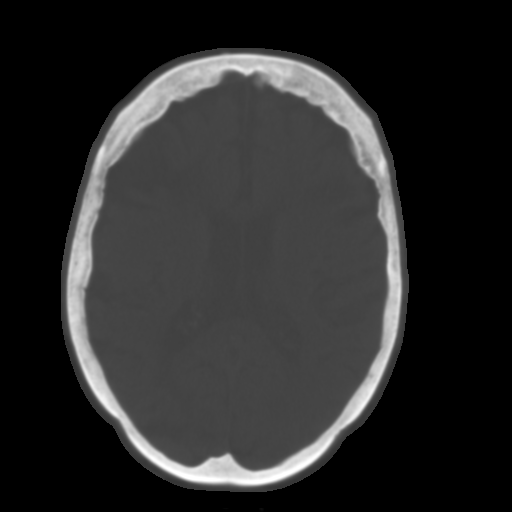
[im 18/32  brain]
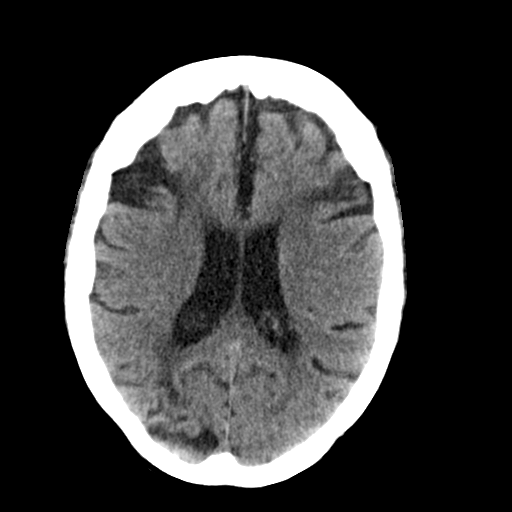
[im 20/32  brain]
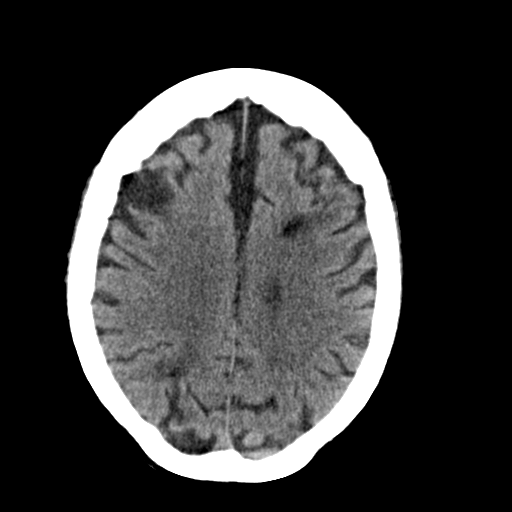
[im 22/32  brain]
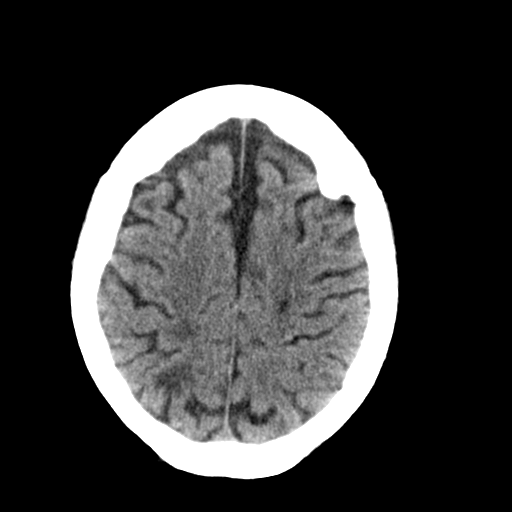
[im 25/32  brain]
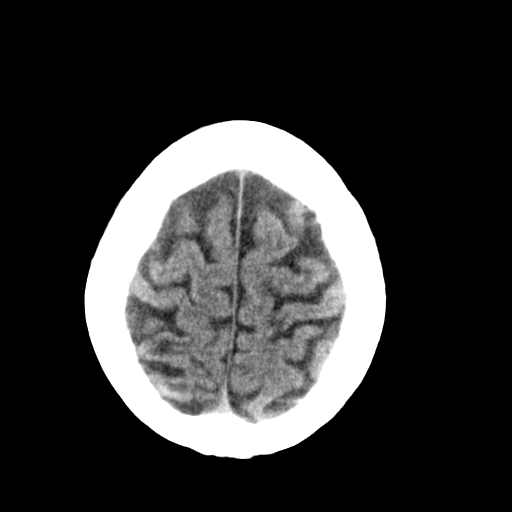
[im 25/32  bone]
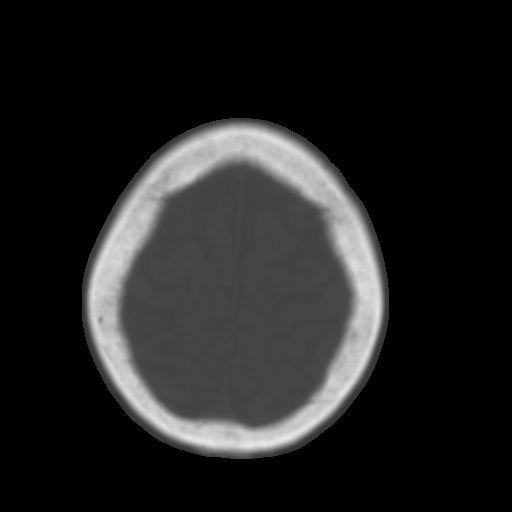
[im 27/32  brain]
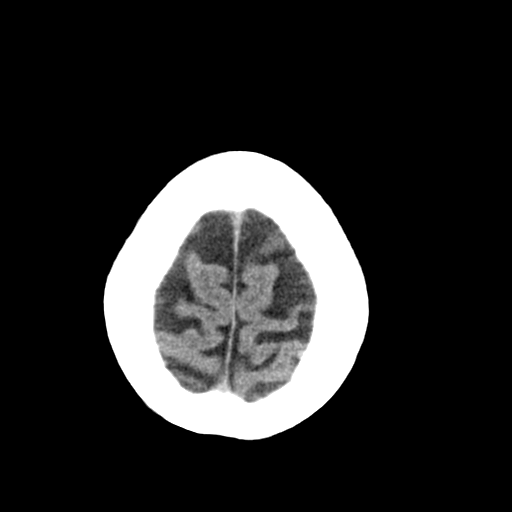
[im 28/32  brain]
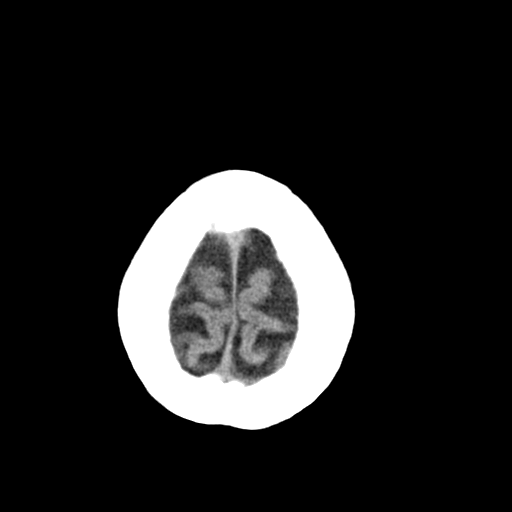
[im 30/32  brain]
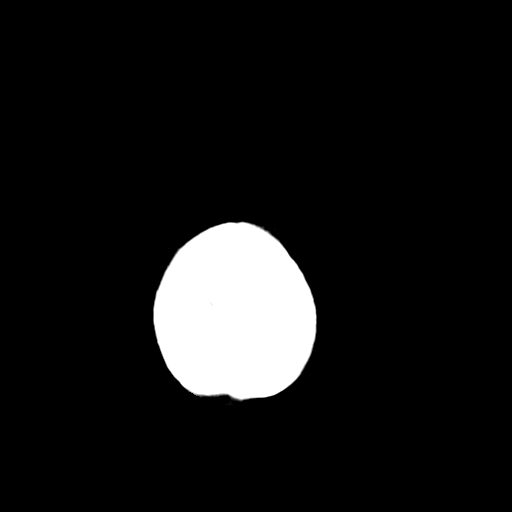

[16 of 30 positions shown; findings below may reference images not displayed]

FINDINGS: Diffuse cerebral atrophy and evidence of prior old infarcts present.
These findings are stable. No acute abnormality identified. No
evidence of mass, hydrocephalus, or acute hemorrhage. No acute bony
abnormality. Postsurgical changes right mastoid.
IMPRESSION: 1.  Stable chronic ischemic changes.  No acute abnormality.

2.  Stable postsurgical changes right mastoid.
